# Patient Record
Sex: Male | Born: 1960 | Race: White | Hispanic: No | Marital: Single | Smoking: Current every day smoker
Health system: Southern US, Community
[De-identification: ages and names within clinical notes are randomized; demographics above are authoritative.]

## PROBLEM LIST (undated history)

## (undated) DIAGNOSIS — E119 Type 2 diabetes mellitus without complications: Secondary | ICD-10-CM

## (undated) DIAGNOSIS — I10 Essential (primary) hypertension: Secondary | ICD-10-CM

## (undated) DIAGNOSIS — R569 Unspecified convulsions: Secondary | ICD-10-CM

## (undated) DIAGNOSIS — M549 Dorsalgia, unspecified: Secondary | ICD-10-CM

## (undated) DIAGNOSIS — K219 Gastro-esophageal reflux disease without esophagitis: Secondary | ICD-10-CM

## (undated) DIAGNOSIS — I2699 Other pulmonary embolism without acute cor pulmonale: Secondary | ICD-10-CM

## (undated) DIAGNOSIS — G8929 Other chronic pain: Secondary | ICD-10-CM

## (undated) HISTORY — DX: Type 2 diabetes mellitus without complications: E11.9

## (undated) HISTORY — DX: Other pulmonary embolism without acute cor pulmonale: I26.99

## (undated) HISTORY — PX: COLONOSCOPY: SHX174

## (undated) HISTORY — PX: BACK SURGERY: SHX140

## (undated) HISTORY — PX: UPPER GASTROINTESTINAL ENDOSCOPY: SHX188

---

## 2006-01-12 ENCOUNTER — Ambulatory Visit: Payer: Self-pay | Admitting: Physician Assistant

## 2006-01-20 ENCOUNTER — Ambulatory Visit: Payer: Self-pay | Admitting: Family Medicine

## 2006-01-27 ENCOUNTER — Ambulatory Visit: Payer: Self-pay | Admitting: Family Medicine

## 2006-02-23 ENCOUNTER — Ambulatory Visit: Payer: Self-pay | Admitting: Family Medicine

## 2006-03-12 ENCOUNTER — Ambulatory Visit: Payer: Self-pay | Admitting: Family Medicine

## 2006-04-02 ENCOUNTER — Ambulatory Visit: Payer: Self-pay | Admitting: Physical Medicine and Rehabilitation

## 2006-04-02 ENCOUNTER — Ambulatory Visit: Payer: Self-pay | Admitting: Family Medicine

## 2006-04-02 ENCOUNTER — Encounter
Admission: RE | Admit: 2006-04-02 | Discharge: 2006-07-01 | Payer: Self-pay | Admitting: Physical Medicine and Rehabilitation

## 2006-04-16 ENCOUNTER — Ambulatory Visit: Payer: Self-pay | Admitting: Family Medicine

## 2006-04-23 ENCOUNTER — Encounter: Admission: RE | Admit: 2006-04-23 | Discharge: 2006-04-23 | Payer: Self-pay | Admitting: Neurosurgery

## 2006-05-18 ENCOUNTER — Ambulatory Visit: Payer: Self-pay | Admitting: Critical Care Medicine

## 2006-05-18 ENCOUNTER — Inpatient Hospital Stay (HOSPITAL_COMMUNITY): Admission: RE | Admit: 2006-05-18 | Discharge: 2006-05-26 | Payer: Self-pay | Admitting: Neurosurgery

## 2006-05-30 ENCOUNTER — Encounter (INDEPENDENT_AMBULATORY_CARE_PROVIDER_SITE_OTHER): Payer: Self-pay | Admitting: Specialist

## 2006-05-30 ENCOUNTER — Inpatient Hospital Stay (HOSPITAL_COMMUNITY): Admission: EM | Admit: 2006-05-30 | Discharge: 2006-06-07 | Payer: Self-pay | Admitting: Emergency Medicine

## 2006-05-30 ENCOUNTER — Ambulatory Visit: Payer: Self-pay | Admitting: Critical Care Medicine

## 2006-06-01 ENCOUNTER — Encounter (INDEPENDENT_AMBULATORY_CARE_PROVIDER_SITE_OTHER): Payer: Self-pay | Admitting: Cardiology

## 2006-06-01 ENCOUNTER — Ambulatory Visit: Payer: Self-pay | Admitting: Infectious Diseases

## 2006-06-02 ENCOUNTER — Encounter (INDEPENDENT_AMBULATORY_CARE_PROVIDER_SITE_OTHER): Payer: Self-pay | Admitting: Specialist

## 2006-08-19 ENCOUNTER — Encounter: Admission: RE | Admit: 2006-08-19 | Discharge: 2006-10-15 | Payer: Self-pay | Admitting: Neurosurgery

## 2007-03-15 ENCOUNTER — Ambulatory Visit: Payer: Self-pay | Admitting: Physical Medicine and Rehabilitation

## 2007-03-15 ENCOUNTER — Encounter
Admission: RE | Admit: 2007-03-15 | Discharge: 2007-06-13 | Payer: Self-pay | Admitting: Physical Medicine and Rehabilitation

## 2007-03-17 ENCOUNTER — Ambulatory Visit (HOSPITAL_COMMUNITY)
Admission: RE | Admit: 2007-03-17 | Discharge: 2007-03-17 | Payer: Self-pay | Admitting: Physical Medicine and Rehabilitation

## 2007-04-28 ENCOUNTER — Ambulatory Visit: Payer: Self-pay | Admitting: Physical Medicine and Rehabilitation

## 2007-05-20 ENCOUNTER — Emergency Department (HOSPITAL_COMMUNITY): Admission: EM | Admit: 2007-05-20 | Discharge: 2007-05-20 | Payer: Self-pay | Admitting: Emergency Medicine

## 2007-06-07 ENCOUNTER — Encounter
Admission: RE | Admit: 2007-06-07 | Discharge: 2007-09-05 | Payer: Self-pay | Admitting: Physical Medicine and Rehabilitation

## 2007-06-10 ENCOUNTER — Ambulatory Visit: Payer: Self-pay | Admitting: Physical Medicine and Rehabilitation

## 2007-07-08 ENCOUNTER — Emergency Department (HOSPITAL_COMMUNITY): Admission: EM | Admit: 2007-07-08 | Discharge: 2007-07-08 | Payer: Self-pay | Admitting: Emergency Medicine

## 2007-07-11 ENCOUNTER — Ambulatory Visit: Payer: Self-pay | Admitting: Physical Medicine and Rehabilitation

## 2008-07-30 IMAGING — RF DG LUMBAR SPINE 2-3V
1 series · 2 of 2 positions shown · non-contrast
Comparison: none

CLINICAL DATA: L5-S1 herniated disk, posterior fusion.  
LUMBAR SPINE ? 2 VIEW:

[Series 1: run · 2 of 2 slices shown]
[im 1/2]
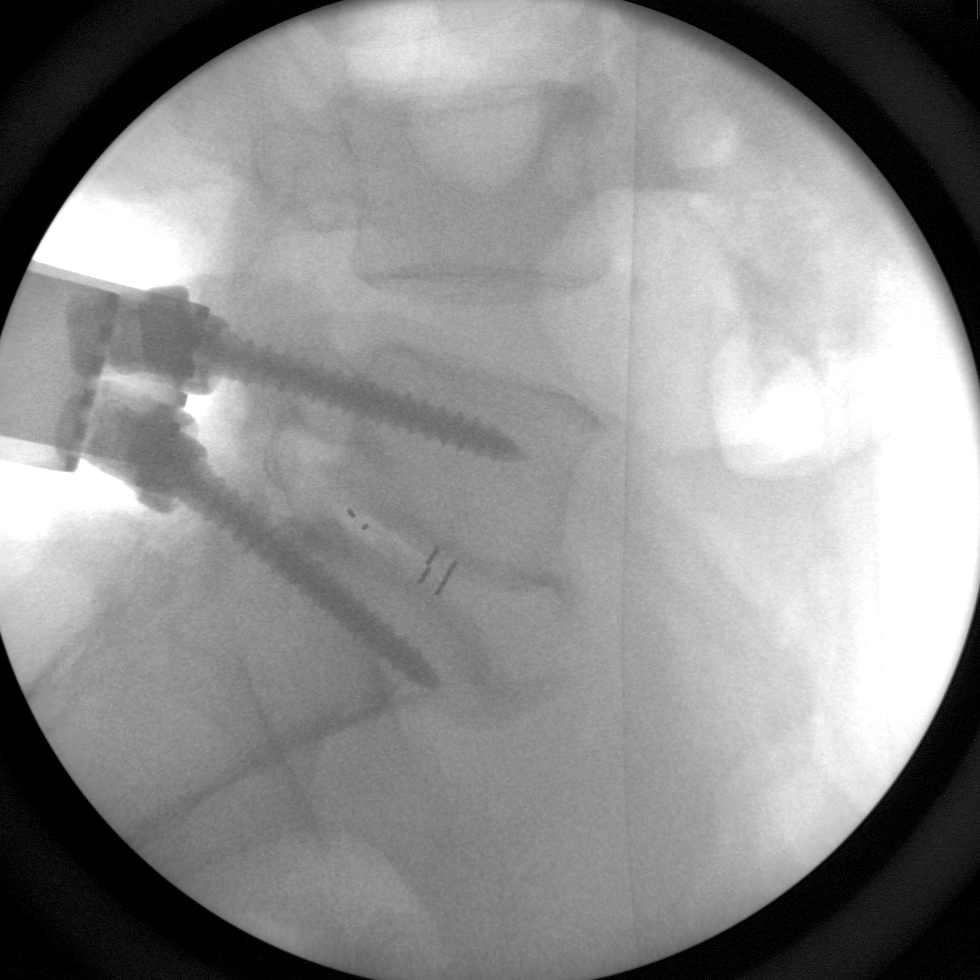
[im 2/2]
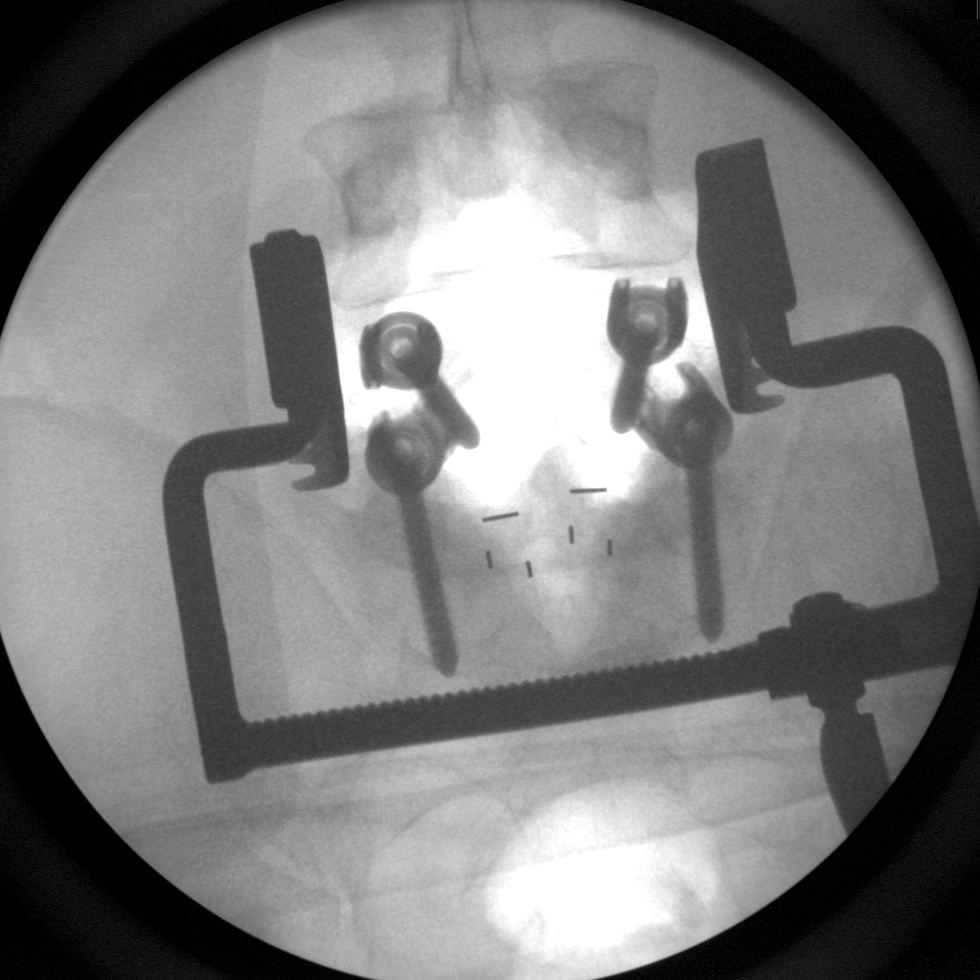

[2 of 2 positions shown; findings below may reference images not displayed]

FINDINGS: Two spot fluoroscopic views demonstrate posterior fusion, pedicle screws and hardware at L5-S1 with a disk spacer.
IMPRESSION: L5-S1 fusion, intraoperative imaging.

## 2008-07-30 IMAGING — CR DG LUMBAR SPINE 1V
1 series · 1 of 1 positions shown · non-contrast
Comparison: none

CLINICAL DATA: L4 laminectomy and L5-S1 diskectomy with PLIF.
 LUMBAR SPINE ? 1 VIEW ? 05/18/06: 
 A single portable intraoperative lateral view of the lumbar spine is provided.

[view not recorded]
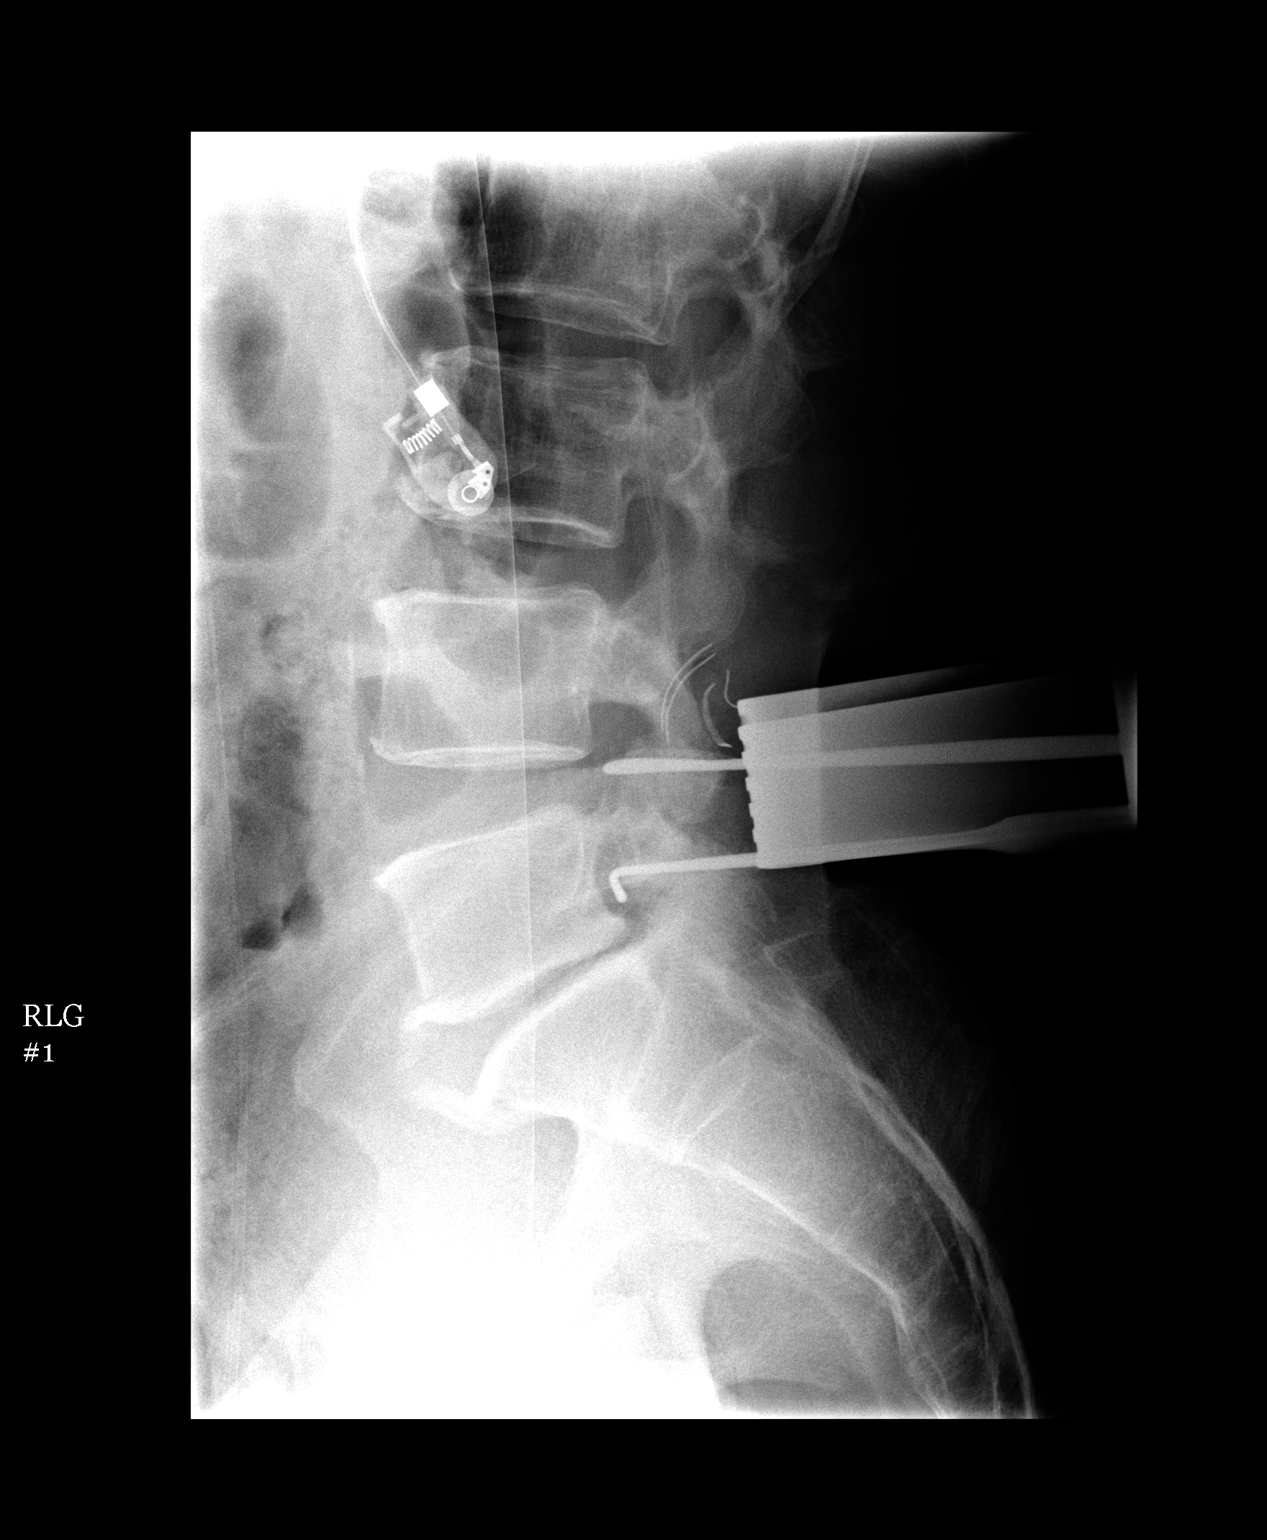

[1 of 1 positions shown; findings below may reference images not displayed]

FINDINGS: Tissue spreaders are in place.  A metallic probe is directed towards the L4-5 level.  A second inferior probe is at the level of the L5-S1 foramina.
IMPRESSION: As discussed above.

## 2008-07-31 IMAGING — CR DG CHEST 1V PORT
1 series · 1 of 1 positions shown · non-contrast
Comparison: 05/14/06.

CLINICAL DATA: Shortness of breath. 
 PORTABLE CHEST - 1 VIEW ? 05/19/06:

[view not recorded]
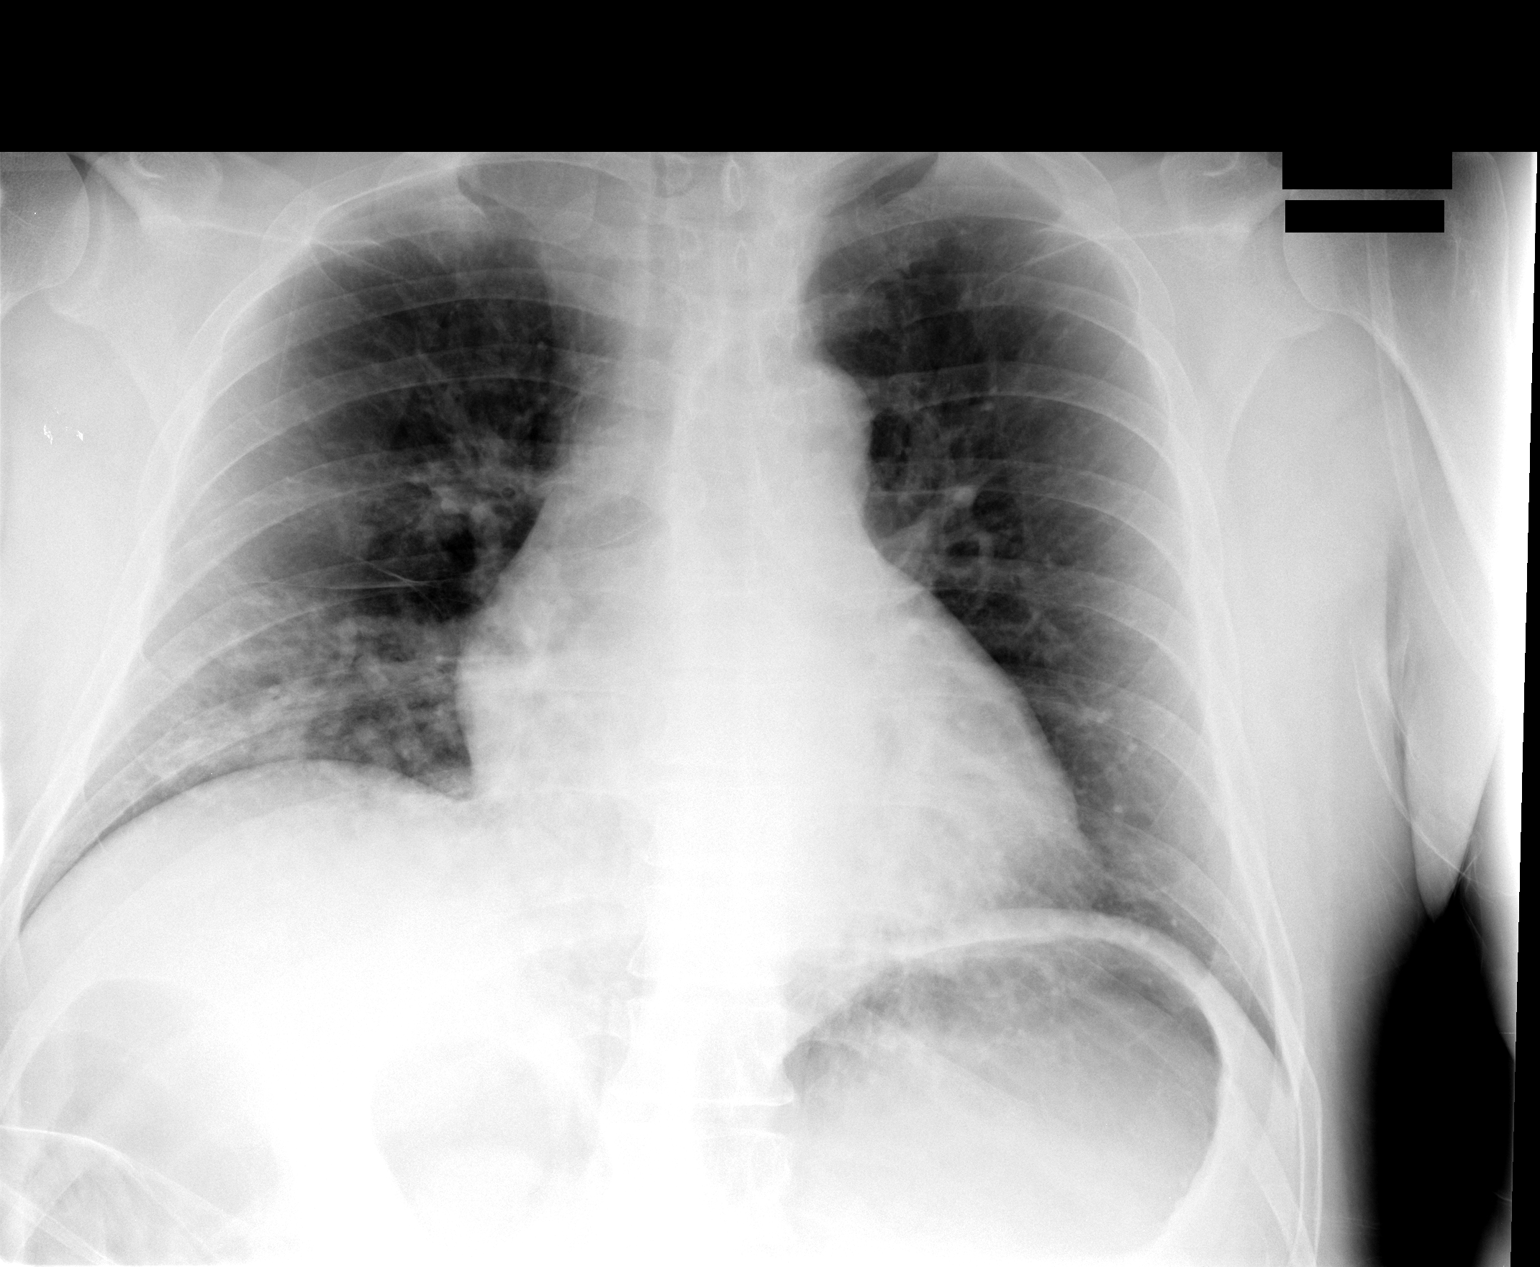

[1 of 1 positions shown; findings below may reference images not displayed]

FINDINGS: There has been interval development of reticular nodular airspace opacities over the right lower lobe, most consistent with pneumonia.  The left lung remains clear.  Heart size is at the upper limits for normal.
IMPRESSION: New right lower lobe airspace opacities most consistent with pneumonia.

## 2008-08-01 IMAGING — CR DG CHEST 1V PORT
1 series · 1 of 1 positions shown · non-contrast
Comparison: 05/19/06.

CLINICAL DATA: Spondylosis/follow-up pneumonia.
 PORTABLE CHEST - 1 VIEW:

[view not recorded]
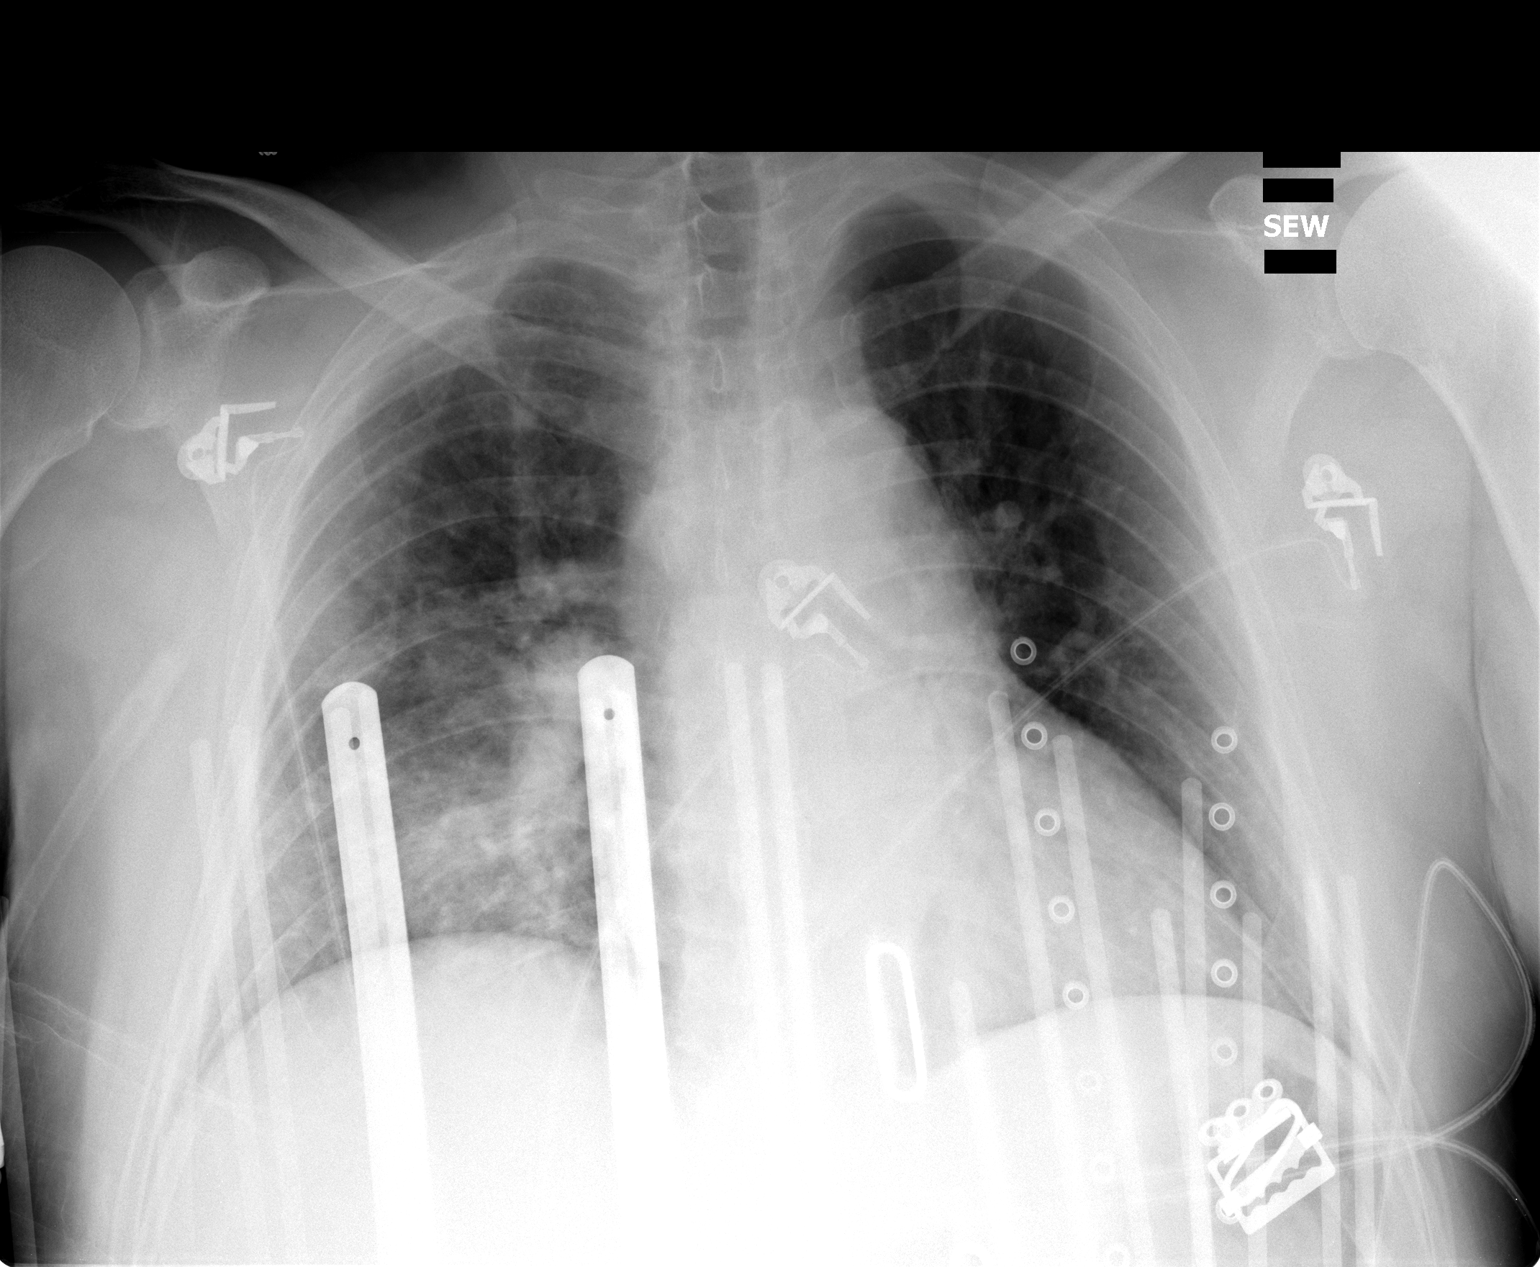

[1 of 1 positions shown; findings below may reference images not displayed]

FINDINGS: The patient is now wearing a back brace with numerous metallic struts.  These are superimposed over the right lower lobe where the patient is known to have airspace infiltrate based on yesterday?s study.
 I believe that the density is a bit more prominent, although it is difficult to accurately compare these two studies because of the overlying artifacts. The left lung appears clear.
IMPRESSION: 1.  Study limited by metallic artifacts related to a back brace that has been applied.
 2.  Probable interval worsening of right lower lobe airspace disease.

## 2008-09-24 ENCOUNTER — Emergency Department (HOSPITAL_COMMUNITY): Admission: EM | Admit: 2008-09-24 | Discharge: 2008-09-24 | Payer: Self-pay | Admitting: Emergency Medicine

## 2009-06-22 ENCOUNTER — Emergency Department (HOSPITAL_COMMUNITY): Admission: EM | Admit: 2009-06-22 | Discharge: 2009-06-22 | Payer: Self-pay | Admitting: Emergency Medicine

## 2010-03-30 ENCOUNTER — Encounter: Payer: Self-pay | Admitting: Neurosurgery

## 2010-06-15 LAB — BASIC METABOLIC PANEL
BUN: 13 mg/dL (ref 6–23)
Chloride: 106 mEq/L (ref 96–112)
Glucose, Bld: 84 mg/dL (ref 70–99)
Potassium: 3.5 mEq/L (ref 3.5–5.1)

## 2010-06-15 LAB — POCT CARDIAC MARKERS: Troponin i, poc: <0.05 ng/mL (ref 0.00–0.09)

## 2010-07-22 NOTE — Assessment & Plan Note (Signed)
[name] is a 50 year old gentleman who has been referred by Dr.  Jeral Burch.  [name] was last seen by me on April 29, 2007.  He is  back in today for a brief recheck.  He continues to have complaints of  low thoracic pain, low lumbar pain, bilateral leg pain.  States his  average pain is about 8 on a scale of 10.  He indicates that his general  activity is significantly compromised by his pain.  He reports poor  sleep but does state today that he is sleeping much better since he is  not using quite so much caffeine anymore.  Pain, overall, is worse with  walking, bending, standing.  Improved with medications.  He is currently  getting good relief with the current meds that he is on.   MEDICATIONS PROVIDED FROM THIS CLINIC:  Percocet 5/325 3 times a day.    He has an NSAID intolerance.   FUNCTIONAL STATUS:  He is currently walking about 18 minutes at a time.  He is able to climb stairs, he is able to drive, he is able to walk  without assistance.  He is independent with his self-care.  Denies  problems controlling bowel or bladder.  Reports occasional depression.  Denies suicidal ideation.  Has some intermittent abdominal pain.  I  asked him to discuss this with his primary care physician, Dr. Hanley Burch.   PAST MEDICAL HISTORY:  No other changes in past medical history.   SOCIAL HISTORY:  Remarkable for death in the family.  His 72 year old  nephew died suddenly and the family is currently grieving.   PHYSICAL EXAMINATION:  VITAL SIGNS:  Blood pressure 132/87, pulse 72,  respirations 18, 99% saturated on room air.  GENERAL:  Well-developed, well-nourished gentleman who is quite tan and  states that he has been using his sister's tanning bed.  He does not  appear in any distress.  He is oriented.  Speech is clear.  His affect  is bright.  Alert, cooperative and pleasant.  He follows commands  easily.  Transitioning from sitting to standing is done with ease.  Gait in the  room is  non-antalgic.  Tandem gait, Romberg's test are performed  adequately.  Limitations are noted in lumbar motion in all planes.  Not  much more than about 25 degrees of forward flexion, essentially no  extension.  Reflexes are 1+ in the patella tendons, diminished at the  Achilles tendon.  Tone is normal, no clonus is noted.  Motor strength is  good in both lower extremities.   IMPRESSION:  1. Low back pain.  2. Bilateral leg pain.  3. Status post L4-5 lumbar laminectomy and L5-S1 interbody fusion      May 18, 2006, complicated by postoperative infection.  4. Trochanteric bursitis/iliotibial band syndrome, worse on the left      than on the right.  5. Reduction in overall caffeine usage.  6. Nicotine addiction.   PLAN:  [name] brings in his pain diary today, which includes his  activity as well.  He has indicated that he is walking at least 20  minutes a day and he has reduced his caffeine use from 2 L a day down to  1 energy drink in the morning.  We will switch him today from Percocet  5/325 3 times a day to Ultracet 2 p.o. t.i.d., #180, no refills and  will restart him back on the Neurontin for leg pain, 300 mg 1 p.o. at  bedtime x3 days, then b.i.d. x3 days, then t.i.d. x3 days, then q.i.d.  We will see him back in a month.  He was also given another pain diary  for next visit.           ______________________________  Brantley Stage, M.D.     DMK/MedQ  D:  06/10/2007 13:12:53  T:  06/10/2007 14:13:22  Job #:  [id number]

## 2010-07-22 NOTE — Assessment & Plan Note (Signed)
William Burch is a 50 year old married gentleman who is a referral from Dr.  Jeral Fruit.  He was seen last by me on April 01, 2007.  William Burch has  primary complaints of low back pain and intermittent left lower  extremity pain.  His average pain is about an 8 on a scale of 10.  He  describes his pain as constant, tingling, aching, sharp in nature. His  sleep tends to be poor, although he states he drinks at least two liters  of [facility] a day.   The pain is worse with walking, bending, standing, improves with his  medications and TENS unit.  He gets fair relief with current med's that  are prescribed to him.   MOBILITY/FUNCTIONAL STATUS:  The patient, at the last visit, was able to  walk about 8 minutes at a time. Currently he is up to 10 to 12.  He has  been walking daily now.  He is able to climb stairs and he is able to  drive. 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   He is independent with feed, bathing, toileting, dressing.  He is  independent with meal prep, shopping, needs some assistance with higher  level household tasks.   He admits to intermittent numbness and tingling in the lower  extremities.  Occasional trouble walking and depression.  He denies  suicidal ideation.   REVIEW OF SYSTEMS:  Otherwise noncontributory in regards to today's  problems.   PAST MEDICAL/SOCIAL/FAMILY HISTORY:  Unchanged, other than that he has  attempted to start acquiring his GED on line.  He is a smoker as well.   MEDICATIONS:  Medications prescribed by our clinic include:  1. Percocet 10/325 up to 2 per day.  2. He had been on Lyrica but this has been discontinued.  3. He also uses Dulcolax on a p.r.n. basis for constipation.   PHYSICAL EXAMINATION:  VITAL SIGNS:  On exam today his blood pressure is  115/75, pulse 72, respirations 18, 99% saturated on room air.  GENERAL APPEARANCE:  He is a well-developed, well-nourished gentleman  who  does not appear in any distress.  He is oriented x3 and his speech  is clear.  His affect is bright.  He is alert, cooperative and pleasant  and he follows commands without difficulty.   He is able to transition easily from sitting to standing.  His gait in  the room is within normal limits and no antalgia is appreciated.   Reflexes are 1+ at patellar tendons, diminished at the Achilles tendon.  No abnormal tone is normal.  No clonus is noted.  He reports intact  sensation to light touch.  Motor strength is good in both lower  extremities, 5/5 at hip flexors, knee extensors, dorsiflexors, plantar  flexors.   He has significant tenderness along the trochanters and down the  iliotibial band bilaterally.   There is no abnormal tone, no clonus is noted.   IMPRESSION:  1. Status post L4-L5 lumbar laminectomy and L5-S1 interbody fusion,      May 18, 2006 complicated by postoperative infection.  2. Lumbago.  3. Trochanteric bursitis/iliotibial band syndrome, worse on left than      on right.  4. Caffeine use.  5. Nicotine addiction.   PLAN:  William Burch intends to cut down on his caffeine intake.  He is  going to monitor the amount of [facility] he has been taking.  He is  requesting medications for insomnia, however, I would like him to reduce  the amount of [facility] he has been taking in.  He is taking at least  2 liters per day.   We will refill the following medications for him today:  1. Percocet 5/325 mg one p.o. q.8h. p.r.n. back pain, #90, no refills.  2. He is also going to continue to work on walking each day.  He will      document this daily in his pain diary and bring this in next month      as well so that I can see how he is doing.  3. Over the next couple of months we would like to transition him to      Ultracet.  He is also in agreement with this plan and he is going      to continue to work on getting his GED as well.   I will see him back in one month.   He has been stable on the above  medications.  We will continue to monitor for any aberrant medication  use.           ______________________________  Brantley Stage, M.D.     DMK/MedQ  D:  04/29/2007 14:37:48  T:  04/30/2007 11:53:22  Job #:  [id number]   cc:   Hilda Lias, M.D.  Fax: [phone]

## 2010-07-22 NOTE — Assessment & Plan Note (Signed)
BRIEF NOTE:  William Burch underwent urine drug screening on Jul 11, 2007.  A report of the urine drug screen by [employer] revealed positive cocaine  metabolites, benzoylecgonine 6,653 NG per ml.  The patient was informed  of his urine drug screen result by certified mail and he was discharged  from the center for pain and rehabilitative medicine, and it was  recommended that he follow up in a drug rehabilitation program.           ______________________________  Brantley Stage, M.D.     DMK/MedQ  D:  07/18/2007 14:32:57  T:  07/18/2007 15:51:41  Job #:  [id number]

## 2010-07-22 NOTE — Assessment & Plan Note (Signed)
William Burch is back in today for a refill of his medication.   He states in the interim he had a fall on some stone steps outside.  He  twisted his ankle and was seen in the emergency department.  He has been  given an air cast and crutched and is going to follow up with  orthopedist for management of this acute injury.   He continues to complain of pain in his low back and bilateral legs,  left worse than right.  Pain is a 9/10, goes down the thigh, down to the  foot.  Bilateral calves are aching, feels like needles are sticking in  them.   Average pain is about a 9 on a scale of 10 interfering significantly  with activity levels, reporting poor sleep.   Patient is concerned about his back and when he fell he is concerned may  have had some hardware disruption.  Apparently, he did not have any x-  rays of his back in the emergency room.   He gets good relief with current meds that he is on.   FUNCTIONAL STATUS:  He is able to walk about 10 minutes at a time,  however, most of the day he is watching TV.  He has difficulty climbing  stairs at this time.  He is able to drive.  He is independent with self-  care and needs assistance with higher-level activities in the home.  Denies problems controlling bowel or bladder.  Admits to a little  depression.  Denies suicidal ideation.  No other changes on review of  systems are noted.   PAST MEDICAL, SOCIAL, FAMILY HISTORY:  Otherwise unchanged.   EXAM:  Blood pressure is 129/90.  Pulse 118.  Respirations 18, 97%  saturated on room air.  Well-developed, well-nourished gentleman who  does not appear in any distress.  He is oriented x3.  Speech is clear.  Affect is alert.  He is cooperative and pleasant.  Overall quiet  demeanor.   He has an air cast on his left ankle and he has crutches with him.   Gait is not tested due to nonweightbearing on that left side.  He has  limitations in lumbar motion.  Overall, good strength is noted in  the  lower extremity.  He reports diminished sensation patchy throughout both  lower extremities as well.   Tone is normal, however.  Left ankle is observed today.  He has edema  diffusely throughout the ankle.  He has difficulty, very limited motion  with flexion and extension at this time with dorsiflexion and plantar  flexion of the ankle.  Tenderness is noted throughout the ankle.  No  ecchymosis is noted.   IMPRESSION:  1. Low back pain status post recent fall.  2. Left ankle sprain.  3. Bilateral lower extremity pain.  4. Status post L4-5 lumbar laminectomy and L5-S1 interbody fusion,      May 18, 2006, complicated by postoperative infection.  5. Trochanteric bursitis, iliotibial band, worse on the left than on      the right.   PLAN:  [name] is requesting followup with Dr. Jeral Fruit after this fall  on Jul 08, 2007.  He has concerns about effusion.  He states his back is  worse and is concerned about his hardware.  Pain is mainly in the mid  lumbar area in the right.   We will obtain lumbar radiographs in the interim, AP, lateral,  flexion/extension, lumbar spine films.  I  request that he follow up with  his orthopedist for his acute ankle sprain.  We will refill the  following medications for him:  1. Prilosec 20 mg 1 p.o. daily, #30.  2. Ultram 50 mg 1 p.o. daily p.r.n. back or leg pain, #60.   I will see him back in a month.           ______________________________  Brantley Stage, M.D.     DMK/MedQ  D:  07/13/2007 09:47:12  T:  07/13/2007 10:12:58  Job #:  [id number]

## 2010-07-22 NOTE — Assessment & Plan Note (Signed)
[name] is a 50 year old gentleman who has been referred back by Dr.  Jeral Fruit.   [name] was initially seen by me last year in January of 2008.  In  the interim, [name] has undergone an L5-S1 fusion complicated by a  postoperative infection.   [name] states he continues to have low back pain and left leg pain.  The pain in the low back radiates through the left buttock region  posteriorly and down into the left calf.  His leg pain is about a 9 on a  scale of 10, in the low back and buttock is about an 8.  Average pain is  an 8 overall.  He states his pain is constant, tingling, achy and sharp  in nature, interfering a lot with general activity.  The pain is  typically worse in the morning and toward the end of the day, worse with  bending, sitting, standing.  Improves with medication.  He is getting  just a little bit of relief with the current medications that he is on.  He is currently taking up to 2 Percocet a day, 10/325.  He has been  trialed on other anti-inflammatory medications, including meloxicam,  which he states made his heart race.  He has also been trialed on  Neurontin and Lyrica, however, on smaller doses.   FUNCTIONAL STATUS:  He is able to walk between 8 to 10 minutes at a  time.  He is able to drive, climb stairs.  He has a Regulatory affairs officer.  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  He last worked back in 2007.  He is independent with his self-  care.  Needs some assistance with higher level household activities.  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  He does have somebody who helps him out with  that.   He denies problems controlling bowel or bladder.  Denies anxiety.  Admits to some depression but denies suicidal ideation.  Admits to some  numbness and tingling in the left lower extremity.   Has some abdominal pain.  He reports he is constipated.   Reports he  has some high blood pressure, is currently not on any  medications for it at this time.   He is currently living with his sister.  Smokes a pack of cigarettes a  day  although he states that he has been down to 1 cigarette a day in  the last week or so.   PHYSICAL EXAMINATION:  VITAL SIGNS:  Blood pressure 130/71, pulse 84,  respirations 18, and 97% saturated on room air.  GENERAL:  This is a well-developed, well-nourished gentleman who does  not appear in any distress.  He is oriented x 3.  Speech is clear.  Affect is bright.  He is alert, cooperative and pleasant.  He follows  commands without any difficulty.   Transitioning from sitting to standing is done easily.  Gait in the room  is antalgic.  Decreased weightbearing through the left lower extremity.  Limitations noted in lumbar motion in all planes.   Reflexes are diminished at the Achilles tendon and present at the  patellar tendon.  No abnormal tone is noted.  No clonus is noted.  Motor  strength is quite good.  Half a grade of weakness noted in the left EHL  compared to the right.  Straight leg raise  negative.   Significant pain noted with internal rotation in the left hip.   Tenderness is noted down both iliotibial bands and noted over the  trochanters bilaterally, worse on the left than on the right as well.   IMPRESSION:  Status post lumbar four laminotomy and foraminotomy, lumbar  five laminotomy with facetectomy, interbody lumbar five-sacral one  fusion, decompression.  Date of surgery May 18, 2006, complicated by  postoperative infection.   The patient has continued complaints of low back pain and left leg pain  with tenderness down the iliotibial band and also pain with internal and  external rotation of the left hip.   PLAN:  Would like to rule out any hip pathology on the left.  Given that  he has worked in [employer] he has had a little more risk for  osteoarthritis of this joint.   We will obtain  x-rays of the low back as well as bilateral hips today.   Would like to eventually get him involved in some physical therapy to  address soft tissue and mobility deficits.   We will trial him on Lyrica.  We will start him on 50 and bring his dose  to 150 over the next 2 weeks.  I will see him back in 2 weeks, review  his x-rays with him.  May consider epidural steroid injections.  May  need to discuss further with Dr. Jeral Fruit regarding the recent fusion.  He  does have a TENS unit, which is a good idea for him.  We will write a  prescription for a Velcro lumbar support, which he can put on for  activities.  We will address his constipation today as well, give him  some medication to help with that.  A urine drug screen is pending.  We  will hold off on prescribing narcotic medications until this is  reviewed.  [name] agrees thus far with our plan, and I have answered  all his questions and we will see him back in 2 weeks.           ______________________________  Brantley Stage, M.D.     DMK/MedQ  D:  03/16/2007 14:51:59  T:  03/16/2007 16:27:56  Job #:  [id number]   cc:   Hilda Lias, M.D.  Fax: [phone]

## 2010-07-22 NOTE — Assessment & Plan Note (Signed)
William Burch is a 50 year old gentleman referred by Dr. Jeral Fruit.   He was last seen by me on March 16, 2007.  In the interim, William Burch  has undergone an L5-S1 fusion complicated by a postoperative infection.   He is back in today to review some recent tests, and for prescription of  medications.   His urine drug screen was consistent with the medications prescribed by  Dr. Jeral Fruit and in appropriate doses.   His hip radiographs were negative.  His lumbar spine film reveals L5-S1  fusion without evidence of complicating features, degenerative disk  changes at L1-2.   William Burch states that his average pain is about 8/10.  He does get good  relief with Percocet that he was taking twice a day.  However, he has  been out of it for a couple weeks now.   His sleep tends to be poor.  Pain is typically localized to the low back  and posteriorly, especially down the left leg and in the left posterior  thigh.   Pain has been improved a bit by wearing the lumbar support, which was  ordered at the last visit.  William Burch obtained the support the same day  that it was ordered, and has been wearing it regularly over the past 2  weeks.   Pain typically is worse with walking, bending, standing.  Improves with  medication.   He is able to walk between 8 and 10 minutes at a time.  He is able to  climb stairs and he does drive.   He is independent with his self care.   He admits to some depression.  Denies suicidal ideation.  Denies  problems controlling bowel or bladder.   He does have some problems with constipation.   PAST MEDICAL/SOCIAL/FAMILY HISTORY:  Otherwise, unchanged from last  visit, although he is complaining of some sinusitis and requesting some  antibiotics from our clinic.  However, I redirect him to his primary  care doctor, Dr. Janna Arch.   Mr. Lant also states that he has some disability hearings pending.   MEDICATIONS PRESCRIBED BY THIS CLINIC:  1. Lyrica 50 mg 1  p.o. t.i.d.  2. Dulcolax tablets.   William Burch states that the Lyrica made his feet swell after about the  third dose, and he stopped taking it.   EXAM:  His blood pressure is 138/91, pulse 81, respirations 18, 99%  saturation on room air.  He is a well-developed, well-nourished gentleman who appears somewhat  tired, but is oriented and follows commands without difficulty.  During  moments of our interview, he does brighten up and smile.   He is able to transition from sitting to standing easily.  His gait in  the room is slow, but quite stable.  No antalgia is appreciated.  His  reflexes are 1+ patellar and Achilles tendons.  Motor strength is good  in both lower extremities.  He has some tightness around the left hip  with range of motion.   There is no abnormal tone noted.  No clonus is noted.  Sensory exam is  not done today.   IMPRESSION:  1. Status post L4-5 lumbar laminectomy and L5-S1 inner body fusions      May 18, 2006 complicated by postoperative infection.  2. Lumbago.  3. Bilateral leg pain, worse on left than on right.  4. Probable trochanteric bursitis, worse on left than on right.   PLAN:  1. After discussing multiple treatment options with Mr.  Burch, he      states he is not interested in taking Lyrica or Neurontin.  He      states he is not interested in any kind of injections to help      manage his low back or leg pain.  2. Antiinflammatory medications in the past have given him stomach      upset and he is not interested in any more of these.   He states the only thing that really helps him is Percocet, which he has  been taking twice a day.   The lumbar support also has given him some relief.   After a long discussion, 30 minutes, William Burch has agreed to try to  slowly increase his walking over the next month.  He is currently able  to walk 10 minutes at a time several times a day.  He will add 2 minutes  each week to improve his overall  activity.   We discussed that we will do a trial of Percocet with him over the next  2 months and anticipate weaning him off of Percocet the beginning of  April.   He has agreed with this plan.  He states he may be interested in  starting Neurontin some time in April, then.  He was given a sheet to  monitor his activity levels.  He is also going to look into obtaining  his GED.  We will see him back again in 1 month.  He was given a  prescription of Percocet 10/325 one p.o. b.i.d. p.r.n. back pain #60  anticipating that this is for a short trial over the next 2 months.  At  that time, we will probably transition him to Ultracet.  He understands  and is aware, and is in agreement with this plan.  We will see him back  in a month.  He is also requesting a letter today stating that he is  currently under my care.           ______________________________  Brantley Stage, M.D.     DMK/MedQ  D:  04/01/2007 09:18:05  T:  04/01/2007 09:54:32  Job #:  [id number]

## 2010-07-25 NOTE — Group Therapy Note (Signed)
INITIAL VISIT   [name] is a single, separated, 50 year old, white gentleman,  who is currently filing for disability.  He presents to our pain and  rehabilitative clinic with predominant complaints of pain in his  bilateral calves.   He states his average pain is about a 8 or a 9 on a scale of 10, that is  worsened when he is up walking and is worsened when he sleeps.  Pain is  described as sharp and aching in nature, fairly constant, interfering  quite a bit with his activity levels.  He states he has overall poor  sleep.   Pain improves with medications.  He states that the only medicine he has  found fairly good relief with is Norco, hydrocodone acetaminophen  combination.   He states he does have an appointment with Dr. Arville Lime this Thursday for  further evaluation.   William Burch states he has had a long history of low back pain, and  approximately 2 years ago was followed [place] [facility].           ______________________________  Brantley Stage, M.D.     DMK/MedQ  D:  04/05/2006 12:40:46  T:  04/05/2006 13:34:47  Job #:  [id number]

## 2010-07-25 NOTE — Op Note (Signed)
William Burch, William Burch NO.:  [id number]   MEDICAL RECORD NO.:  [id number]          PATIENT TYPE:  INP   LOCATION:  2550                         FACILITY:  [facility]   PHYSICIAN:  Hilda Lias, M.D.   DATE OF BIRTH:  1960/10/28   DATE OF PROCEDURE:  05/31/2006  DATE OF DISCHARGE:                               OPERATIVE REPORT   ADMISSION DIAGNOSIS:  Rule out wound infection, sepsis, status post  lumbar fusion.  Status post pneumonia.   POSTOPERATIVE DIAGNOSIS:  Rule out wound infection, sepsis, status post  lumbar fusion.  Status post pneumonia.   PROCEDURE:  Incision and drainage of lumbar wound.   SURGEON:  Hilda Lias, M.D.   ASSISTANT:  Stefani Dama, M.D.   HISTORY OF PRESENT ILLNESS:  The patient, [name], underwent fusion  several weeks ago.  The patient soon after developed fever and it was  found by x-rays that he had pneumonia.  He was seen by critical care  unit and eventually he had intravenous antibiotics and was discharged  after he was afebrile.  Today he showed up at the emergency room with  increasing pain, being unable to move and quite tender in lumbar area.  MRI showed fluid in that area and the chest x-ray was negative.  Because  of the findings surgery was advised.   PROCEDURE:  The patient was taken to the operating room positioned prone  manner.  The back was prepped with DuraPrep.  Incision following the  previous one was made.  The subcutaneous tissue was cleared, we opened  the fascia and what we found was sort of serosanguineous fluid but there  was no evidence of any pus either by smell or by looking.  Nevertheless  specimen was sent to laboratory for aerobic and anaerobic as well tissue  was sent for further pathology work.  After that the area was irrigated.  Hemovac was left in the epidural area and the wound was closed with  Vicryl and staples.  We are going to because of the findings during  surgery does not explain  his temperature of 104 as well as the  sedimentation rate of over 100, we are going to call infectious disease  plus critical care to help [place] with his care.           ______________________________  Hilda Lias, M.D.     EB/MEDQ  D:  05/31/2006  T:  05/31/2006  Job:  [id number]

## 2010-07-25 NOTE — H&P (Signed)
NAMEZEEK, ROSTRON NO.:  [id number]   MEDICAL RECORD NO.:  [id number]          PATIENT TYPE:  INP   LOCATION:  2550                         FACILITY:  [facility]   PHYSICIAN:  Stefani Dama, M.D.  DATE OF BIRTH:  March 05, 1961   DATE OF ADMISSION:  05/30/2006  DATE OF DISCHARGE:                              HISTORY & PHYSICAL   ADMISSION DIAGNOSIS:  Lumbar wound infection.   HISTORY OF PRESENT ILLNESS:  Mr. Sciandra is a 50 year old individual who  was initially evaluated at the end of January for back pain and also  neck pain.  He has been having neck pain with radiation to both upper  extremities and also back pain with radiation to both lower extremities.  He was scheduled to have surgery in [facility] and later on by a  neurosurgeon here in [place].  Somehow, he lost his insurance and he  has never had follow up.  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   PAST MEDICAL HISTORY:  History of double pneumonia in the last month and  a half.  He was treated by the critical care service and Dr. Danise Mina.  This condition has been remedied, and the patient is felt to be ready  for surgical intervention on his lumbar spine.  He was admitted.  He was  discharged home on May 26, 2006.  It was noted that during the  postoperative period, he complained significantly of pain.  He had been  given vancomycin and Fortaz during the postoperative period, and he was  discharged home on significant pain medication.   PHYSICAL EXAMINATION:  GENERAL:  He is a very ill-appearing individual  in moderate distress.  He is lying in the fetal position.  He will not  straighten his back.  Any palpation of his back reveals  significant pain  and tenderness.  Percussion tenderness was not tried, as simple  palpation caused substantial pain.  The patient also complained of pain  across the neck and the shoulders.  He will lie flat with only great  difficulty and great discomfort.  He does move his legs well to  confrontational testing, as best I can determine.  His leg strength is  at least 4/5 in the iliopsoas, the quadriceps, the tibialis anterior and  the gastrocs.  His deep tendon reflexes are 1+ in the patellae, trace in  the Achilles.  Babinski's are downgoing.  Upper extremity reflexes are  2+ in the biceps, 1+ in the triceps, 1+ in brachial radialis.  HEENT:  Cranial nerve examination reveals the pupils are 4 mm, briskly  reactive to light and accommodation.  Extraocular movements are full.  The face is symmetric.  Tongue and uvula are midline.  Sclerae and  conjunctivae are clear.  NECK:  No masses, no bruits are  heard.  LUNGS:  Clear to auscultation.  HEART:  Regular rate and rhythm.  No murmur is heard.  ABDOMEN:  Soft.  Bowel sounds are positive.  No masses are palpable.  EXTREMITIES:  No cyanosis, clubbing or edema.  BACK:  A well-healed incision that is extremely tender.  There is a  slight reddish hue over the surface of the skin, but no induration that  would suggest the presence of an obvious infection.   IMPRESSION:  The patient is extremely tender in the low back and is  exquisitely uncomfortable in any given position save for the fetal  position.  He has a temperature to 103.  His white count is 13,000.  An  MRI demonstrates the presence of fluid collection in the surgical site.  He is now to be admitted and taken to the operating room to undergo  surgical debridement of his lumbar wound.  His sedimentation rate noted  this evening is 105.      Stefani Dama, M.D.  Electronically Signed     HJE/MEDQ  D:  05/30/2006  T:  05/31/2006  Job:  [id number]

## 2010-07-25 NOTE — Discharge Summary (Signed)
[name], [name] NO.:  [id number]   MEDICAL RECORD NO.:  [id number]          PATIENT TYPE:  INP   LOCATION:  3035                         FACILITY:  [facility]   PHYSICIAN:  Hilda Lias, M.D.   DATE OF BIRTH:  1960-11-22   DATE OF ADMISSION:  05/30/2006  DATE OF DISCHARGE:  06/07/2006                               DISCHARGE SUMMARY   ADMISSION DIAGNOSIS:  Wound infection.   FINAL DIAGNOSIS:  Fever of undetermined origin.   CLINICAL HISTORY:  William Burch is a gentleman who was admitted several  weeks ago for lumbar fusion.  Immediately after surgery, he developed a  pneumonia.  He was seen by the pulmonologist and with conservative  treatment, including antibiotic, he became afebrile and discharged.  He  went home, but he came back 72 hours later with temperature going up to  107.  A complete febrile workup was done, and because of the possibility  of infection, because of the MRI, we proceeded with I&D of the lumbar  wound.  He was admitted to the hospital.   LABORATORY:  At the present time, the wound culture was negative.  The  electrolytes are normal.  The white cell is at 10.7.  Blood culture  urine, and the wound culture from the day of surgery was also negative.   COURSE IN THE HOSPITAL:  The patient was taken to surgery.  I&D of the  lumbar wound was done.  During this procedure, we found no evidence of  any infection whatsoever.  Culture, which were sent to the laboratory  were negative.  The patient was seen by infectious disease.  He was put  on antibiotics, and the first two days, the temperature went to 107 and  106 and the white cell was normal.  Eventually with antibiotics, the  temperature came back to normal.  Today he is doing great.  He is  ambulating.  He has no fever.  He has been afebrile for at least 72  hours.  The wound looks fine.  He is ready to go home.  Again, all the  cultures were negative.   CONDITION ON DISCHARGE:   Improving.   MEDICATIONS:  He is going to be taking Cipro and Zovyr as per infectious  disease.  He will continue taking the pain medication, the muscle  relaxant.   FOLLOWUP:  He will be seen by me in 3 weeks or as needed.   CONDITION ON DISCHARGE:  Improvement.   ACTIVITY:  Not to drive, not to do any heavy lifting.           ______________________________  Hilda Lias, M.D.     EB/MEDQ  D:  06/07/2006  T:  06/07/2006  Job:  [id number]

## 2012-01-05 ENCOUNTER — Encounter (HOSPITAL_COMMUNITY): Payer: Self-pay | Admitting: *Deleted

## 2012-01-05 ENCOUNTER — Emergency Department (HOSPITAL_COMMUNITY)
Admission: EM | Admit: 2012-01-05 | Discharge: 2012-01-05 | Disposition: A | Discharge status: Home / Self Care | Payer: Self-pay | Attending: Emergency Medicine | Admitting: Emergency Medicine

## 2012-01-05 DIAGNOSIS — Z79899 Other long term (current) drug therapy: Secondary | ICD-10-CM | POA: Insufficient documentation

## 2012-01-05 DIAGNOSIS — M549 Dorsalgia, unspecified: Secondary | ICD-10-CM | POA: Insufficient documentation

## 2012-01-05 DIAGNOSIS — M436 Torticollis: Secondary | ICD-10-CM | POA: Insufficient documentation

## 2012-01-05 MED ORDER — METHOCARBAMOL 500 MG PO TABS: ORAL_TABLET | ORAL | Status: DC

## 2012-01-05 MED ORDER — HYDROCODONE-ACETAMINOPHEN 7.5-325 MG PO TABS: 1.0000 | ORAL_TABLET | ORAL | Status: DC | PRN

## 2012-01-05 NOTE — ED Notes (Signed)
Pt presents with neck pain that he awoke with this morning. Pt Denies injury.

## 2012-01-05 NOTE — ED Notes (Signed)
Lt side of neck and shoulder pain, on awakening today, increases with movement

## 2012-01-05 NOTE — ED Provider Notes (Signed)
History     CSN: [id number]  Arrival date & time 01/05/12  1606   First MD Initiated Contact with Patient 01/05/12 1707      Chief Complaint  Patient presents with  . Neck Pain    (Consider location/radiation/quality/duration/timing/severity/associated sxs/prior treatment) Patient is a 51 y.o. male presenting with neck pain. The history is provided by the patient.  Neck Pain  This is a new problem. The current episode started 3 to 5 hours ago. The problem occurs constantly. The problem has not changed since onset.The pain is associated with an unknown (Pt states he awakened with right neck and shoulder pain. No reported injury or trauma.) factor. There has been no fever. The pain is present in the right side. The quality of the pain is described as cramping. The pain radiates to the right shoulder and right scapula. The pain is moderate. Exacerbated by: Attempting to reposition the neck. The pain is the same all the time. Pertinent negatives include no photophobia, no chest pain, no numbness, no bowel incontinence and no bladder incontinence. He has tried nothing for the symptoms.    History reviewed. No pertinent past medical history.  Past Surgical History  Procedure Date  . Back surgery     History reviewed. No pertinent family history.  History  Substance Use Topics  . Smoking status: Never Smoker   . Smokeless tobacco: Not on file  . Alcohol Use: No      Review of Systems  Constitutional: Negative for activity change.       All ROS Neg except as noted in HPI  HENT: Positive for neck pain. Negative for nosebleeds.   Eyes: Negative for photophobia and discharge.  Respiratory: Negative for cough, shortness of breath and wheezing.   Cardiovascular: Negative for chest pain and palpitations.  Gastrointestinal: Negative for abdominal pain, blood in stool and bowel incontinence.  Genitourinary: Negative for bladder incontinence, dysuria, frequency and hematuria.    Musculoskeletal: Positive for back pain. Negative for arthralgias.  Skin: Negative.   Neurological: Negative for dizziness, seizures, speech difficulty and numbness.  Psychiatric/Behavioral: Negative for hallucinations and confusion.    Allergies  Morphine and related and Sudafed  Home Medications   Current Outpatient Rx  Name Route Sig Dispense Refill  . ACETAMINOPHEN 500 MG PO TABS Oral Take 1,000 mg by mouth every 6 (six) hours as needed. For pain    . IBUPROFEN 200 MG PO TABS Oral Take 400 mg by mouth every 6 (six) hours as needed. For pain    . METOPROLOL TARTRATE 25 MG PO TABS Oral Take 25 mg by mouth 2 (two) times daily.    Marland Kitchen HYDROCODONE-ACETAMINOPHEN 7.5-325 MG PO TABS Oral Take 1 tablet by mouth every 4 (four) hours as needed for pain. 20 tablet 0  . METHOCARBAMOL 500 MG PO TABS  2 po tid for spasm 30 tablet 0    BP 138/94  Pulse 77  Temp 98.4 F (36.9 C) (Oral)  Resp 18  Ht 5\' 9"  (1.753 m)  Wt 190 lb (86.183 kg)  BMI 28.06 kg/m2  SpO2 100%  Physical Exam  Nursing note and vitals reviewed. Constitutional: He is oriented to person, place, and time. He appears well-developed and well-nourished.  Non-toxic appearance.  HENT:  Head: Normocephalic.  Right Ear: Tympanic membrane and external ear normal.  Left Ear: Tympanic membrane and external ear normal.  Eyes: EOM and lids are normal. Pupils are equal, round, and reactive to light.  Neck: Normal range of  motion. Neck supple. Carotid bruit is not present.       Right trapezius spasm to palpation. Pain with attempted ROM.   Cardiovascular: Normal rate, regular rhythm, normal heart sounds, intact distal pulses and normal pulses.   Pulmonary/Chest: Breath sounds normal. No respiratory distress.  Abdominal: Soft. Bowel sounds are normal. There is no tenderness. There is no guarding.  Musculoskeletal: Normal range of motion.  Lymphadenopathy:       Head (right side): No submandibular adenopathy present.       Head  (left side): No submandibular adenopathy present.    He has no cervical adenopathy.  Neurological: He is alert and oriented to person, place, and time. He has normal strength. No cranial nerve deficit or sensory deficit.  Skin: Skin is warm and dry.  Psychiatric: He has a normal mood and affect. His speech is normal.    ED Course  Procedures (including critical care time)  Labs Reviewed - No data to display No results found.   1. Torticollis       MDM  I have reviewed nursing notes, vital signs, and all appropriate lab and imaging results for this patient. Exam is consistent with Torticollis on the right. Pt is treated with heat, ibuprofen, robaxin and norco. Pt to see his MD or orthopedic MD of choice if not improving.       Kathie Dike, [place] 01/05/12 1722

## 2012-01-07 NOTE — ED Provider Notes (Signed)
Medical screening examination/treatment/procedure(s) were performed by non-physician practitioner and as supervising physician I was immediately available for consultation/collaboration.   Faria Casella M Zechariah Bissonnette, DO 01/07/12 1219 

## 2012-09-10 ENCOUNTER — Emergency Department (HOSPITAL_COMMUNITY): Payer: Medicare Other

## 2012-09-10 ENCOUNTER — Emergency Department (HOSPITAL_COMMUNITY)
Admission: EM | Admit: 2012-09-10 | Discharge: 2012-09-10 | Disposition: A | Discharge status: Home / Self Care | Payer: Medicare Other | Attending: Emergency Medicine | Admitting: Emergency Medicine

## 2012-09-10 ENCOUNTER — Other Ambulatory Visit: Payer: Self-pay

## 2012-09-10 ENCOUNTER — Encounter (HOSPITAL_COMMUNITY): Payer: Self-pay | Admitting: *Deleted

## 2012-09-10 DIAGNOSIS — Z79899 Other long term (current) drug therapy: Secondary | ICD-10-CM | POA: Insufficient documentation

## 2012-09-10 DIAGNOSIS — R5381 Other malaise: Secondary | ICD-10-CM | POA: Insufficient documentation

## 2012-09-10 DIAGNOSIS — F17200 Nicotine dependence, unspecified, uncomplicated: Secondary | ICD-10-CM | POA: Insufficient documentation

## 2012-09-10 DIAGNOSIS — R4789 Other speech disturbances: Secondary | ICD-10-CM | POA: Insufficient documentation

## 2012-09-10 DIAGNOSIS — K219 Gastro-esophageal reflux disease without esophagitis: Secondary | ICD-10-CM | POA: Insufficient documentation

## 2012-09-10 DIAGNOSIS — I10 Essential (primary) hypertension: Secondary | ICD-10-CM | POA: Insufficient documentation

## 2012-09-10 HISTORY — DX: Gastro-esophageal reflux disease without esophagitis: K21.9

## 2012-09-10 HISTORY — DX: Essential (primary) hypertension: I10

## 2012-09-10 LAB — CBC WITH DIFFERENTIAL/PLATELET
Basophils Absolute: 0 10*3/uL (ref 0.0–0.1)
HCT: 39.1 % (ref 39.0–52.0)
Hemoglobin: 13 g/dL (ref 13.0–17.0)
Lymphocytes Relative: 21 % (ref 12–46)
Lymphs Abs: 2.1 10*3/uL (ref 0.7–4.0)
Monocytes Absolute: 0.7 10*3/uL (ref 0.1–1.0)
Monocytes Relative: 7 % (ref 3–12)
Neutro Abs: 6.8 10*3/uL (ref 1.7–7.7)
WBC: 9.8 10*3/uL (ref 4.0–10.5)

## 2012-09-10 LAB — BASIC METABOLIC PANEL
BUN: 23 mg/dL (ref 6–23)
CO2: 28 mEq/L (ref 19–32)
Chloride: 102 mEq/L (ref 96–112)
Creatinine, Ser: 1.07 mg/dL (ref 0.50–1.35)

## 2012-09-10 LAB — TROPONIN I: Troponin I: <0.3 ng/mL (ref <0.30)

## 2012-09-10 MED ORDER — METOCLOPRAMIDE HCL 5 MG/ML IJ SOLN: 10.0000 mg | Freq: Once | INTRAMUSCULAR | Status: DC

## 2012-09-10 MED ORDER — DIPHENHYDRAMINE HCL 50 MG/ML IJ SOLN: 25.0000 mg | Freq: Once | INTRAMUSCULAR | Status: DC

## 2012-09-10 MED ORDER — KETOROLAC TROMETHAMINE 30 MG/ML IJ SOLN: 30.0000 mg | Freq: Once | INTRAMUSCULAR | Status: DC

## 2012-09-10 NOTE — ED Notes (Signed)
Dizziness and near syncopal episodes starting yesterday.  EMS reports bradycardia upon arrial, 50-55 bpm.  Denies cp/sob.  cbg en route 197.

## 2012-09-10 NOTE — Discharge Instructions (Signed)
Take one aspirin daily and follow up with your md next week.

## 2012-09-10 NOTE — ED Provider Notes (Signed)
History    CSN: [id number] Arrival date & time 09/10/12  1233  First MD Initiated Contact with Patient 09/10/12 1310     Chief Complaint  Patient presents with  . Bradycardia   (Consider location/radiation/quality/duration/timing/severity/associated sxs/prior Treatment) Patient is a 51 y.o. male presenting with weakness. History provided by: the pt states he feels week today.  pt had some slurred speech yesterday according to his wife. No language interpreter was used.  Weakness This is a new problem. The current episode started yesterday. The problem occurs constantly. The problem has not changed since onset.Pertinent negatives include no chest pain, no abdominal pain and no headaches. Nothing aggravates the symptoms. Nothing relieves the symptoms.   Past Medical History  Diagnosis Date  . Hypertension   . Acid reflux    Past Surgical History  Procedure Laterality Date  . Back surgery     No family history on file. History  Substance Use Topics  . Smoking status: Current Every Day Smoker    Types: Cigarettes  . Smokeless tobacco: Not on file  . Alcohol Use: No    Review of Systems  Constitutional: Negative for appetite change and fatigue.  HENT: Negative for congestion, sinus pressure and ear discharge.   Eyes: Negative for discharge.  Respiratory: Negative for cough.   Cardiovascular: Negative for chest pain.  Gastrointestinal: Negative for abdominal pain and diarrhea.  Genitourinary: Negative for frequency and hematuria.  Musculoskeletal: Negative for back pain.  Skin: Negative for rash.  Neurological: Positive for weakness. Negative for seizures and headaches.  Psychiatric/Behavioral: Negative for hallucinations.    Allergies  Morphine and related and Sudafed  Home Medications   Current Outpatient Rx  Name  Route  Sig  Dispense  Refill  . esomeprazole (NEXIUM) 40 MG capsule   Oral   Take 40 mg by mouth daily before breakfast.         . ibuprofen  (ADVIL,MOTRIN) 200 MG tablet   Oral   Take 600 mg by mouth every 6 (six) hours as needed. For pain         . metoprolol tartrate (LOPRESSOR) 25 MG tablet   Oral   Take 25 mg by mouth 2 (two) times daily.         Marland Kitchen oxyCODONE-acetaminophen (PERCOCET/ROXICET) 5-325 MG per tablet   Oral   Take 1 tablet by mouth every 6 (six) hours as needed for pain.          BP 163/102  Pulse 59  Temp(Src) 98.3 F (36.8 C) (Oral)  Resp 20  Ht 5\' 9"  (1.753 m)  Wt 202 lb (91.627 kg)  BMI 29.82 kg/m2  SpO2 98% Physical Exam  Constitutional: He is oriented to person, place, and time. He appears well-developed.  HENT:  Head: Normocephalic.  Eyes: Conjunctivae and EOM are normal. No scleral icterus.  Neck: Neck supple. No thyromegaly present.  Cardiovascular: Normal rate and regular rhythm.  Exam reveals no gallop and no friction rub.   No murmur heard. Pulmonary/Chest: No stridor. He has no wheezes. He has no rales. He exhibits no tenderness.  Abdominal: He exhibits no distension. There is no tenderness. There is no rebound.  Musculoskeletal: Normal range of motion. He exhibits no edema.  Lymphadenopathy:    He has no cervical adenopathy.  Neurological: He is oriented to person, place, and time. Coordination normal.  Skin: No rash noted. No erythema.  Psychiatric: He has a normal mood and affect. His behavior is normal.    ED  Course  Procedures (including critical care time) Labs Reviewed  BASIC METABOLIC PANEL - Abnormal; Notable for the following:    Sodium 134 (*)    GFR calc non Af Amer 77 (*)    GFR calc Af Amer 90 (*)    All other components within normal limits  CBC WITH DIFFERENTIAL  TROPONIN I   Ct Head Wo Contrast  09/10/2012   *RADIOLOGY REPORT*  Clinical Data:  Weakness and syncope  CT HEAD WITHOUT CONTRAST  Technique:  Contiguous axial images were obtained from the base of the skull through the vertex without contrast.  Comparison: 05/20/2007  Findings: The brain has a  normal appearance without evidence for hemorrhage, infarction, hydrocephalus, or mass lesion.  There is no extra axial fluid collection.  The skull and paranasal sinuses are normal.  IMPRESSION:  1.  Normal exam.   Original Report Authenticated By: Signa Kell, M.D.   Dg Chest Portable 1 View  09/10/2012   *RADIOLOGY REPORT*  Clinical Data: Bradycardia  PORTABLE CHEST - 1 VIEW  Comparison: 09/24/2008  Findings: Chronic interstitial markings.  No focal consolidation. No pleural effusion or pneumothorax.  Mild cardiomegaly.  IMPRESSION: No evidence of acute cardiopulmonary disease.   Original Report Authenticated By: Charline Bills, M.D.   1. Hypertension     Date: 09/10/2012  Rate: 46  Rhythm: sinus bradycardia  QRS Axis: normal  Intervals: normal  ST/T Wave abnormalities: normal  Conduction Disutrbances:none  Narrative Interpretation:   Old EKG Reviewed: none available   MDM  Weakness unknown cause,  Bradycardia from beta blocker.  Possible tia the day before coming to er.  Pt to start aspirin   Benny Lennert, MD 09/10/12 [phone]

## 2012-10-20 ENCOUNTER — Encounter: Payer: Self-pay | Admitting: Cardiovascular Disease

## 2012-10-20 ENCOUNTER — Ambulatory Visit (INDEPENDENT_AMBULATORY_CARE_PROVIDER_SITE_OTHER): Payer: Medicare Other | Admitting: Cardiovascular Disease

## 2012-10-20 VITALS — BP 123/83 | HR 83 | Ht 69.0 in | Wt 199.0 lb

## 2012-10-20 DIAGNOSIS — R001 Bradycardia, unspecified: Secondary | ICD-10-CM | POA: Insufficient documentation

## 2012-10-20 DIAGNOSIS — I10 Essential (primary) hypertension: Secondary | ICD-10-CM

## 2012-10-20 DIAGNOSIS — Z716 Tobacco abuse counseling: Secondary | ICD-10-CM | POA: Insufficient documentation

## 2012-10-20 DIAGNOSIS — Z7189 Other specified counseling: Secondary | ICD-10-CM

## 2012-10-20 DIAGNOSIS — I498 Other specified cardiac arrhythmias: Secondary | ICD-10-CM

## 2012-10-20 DIAGNOSIS — F17200 Nicotine dependence, unspecified, uncomplicated: Secondary | ICD-10-CM

## 2012-10-20 NOTE — Progress Notes (Signed)
Patient ID: William Burch, male   DOB: [birth year], 52 y.o.   MRN: [id number]    CARDIOLOGY CONSULT NOTE  Patient ID: William Burch MRN: [id number] DOB/AGE: Aug 06, 1960 52 y.o.    HPI: William Burch has a h/o HTN, tobacco abuse, and GERD, who presented to the ED with some slurred speech in early July and was found to be bradycardic (sinus bradycardia by ECG), with an HR of 46 bpm. A head CT was done which was normal, as there was concern for CVA. He had been on Metoprolol twice daily at that time, which was discontinued.  The patient denies any symptoms of chest pain, palpitations, shortness of breath, lightheadedness, dizziness, leg swelling, orthopnea, PND, and syncope.  He walks a mile daily. He denies alcohol use.   He has 4 children, 2 of whom are adopted.  His anxiety is under better control.    Review of systems complete and found to be negative unless listed above in HPI  Past Medical History: See HPI  No family history on file.  History   Social History  . Marital Status: Single    Spouse Name: N/A    Number of Children: N/A  . Years of Education: N/A   Occupational History  . Not on file.   Social History Main Topics  . Smoking status: Current Every Day Smoker    Types: Cigarettes  . Smokeless tobacco: Not on file  . Alcohol Use: No  . Drug Use: No  . Sexual Activity: Not on file   Other Topics Concern  . Not on file   Social History Narrative  . No narrative on file      Physical exam Blood pressure 123/83, pulse 83, height 5\' 9"  (1.753 m), weight 199 lb (90.266 kg). General: NAD Neck: No JVD, no thyromegaly or thyroid nodule.  Lungs: Clear to auscultation bilaterally with normal respiratory effort. CV: Nondisplaced PMI.  Heart regular S1/S2, no S3/S4, no murmur.  No peripheral edema.  No carotid bruit.  Normal pedal pulses.  Abdomen: Soft, nontender, no hepatosplenomegaly, no distention.  Skin: Intact without lesions or rashes.  Neurologic:  Alert and oriented x 3.  Psych: Normal affect. Extremities: No clubbing or cyanosis.  HEENT: Normal.   Labs:   Lab Results  Component Value Date   WBC 9.8 09/10/2012   HGB 13.0 09/10/2012   HCT 39.1 09/10/2012   MCV 88.7 09/10/2012   PLT 271 09/10/2012   No results found for this basename: NA, K, CL, CO2, BUN, CREATININE, CALCIUM, LABALBU, PROT, BILITOT, ALKPHOS, ALT, AST, GLUCOSE,  in the last 168 hours Lab Results  Component Value Date   TROPONINI <0.30 09/10/2012    No results found for this basename: CHOL   No results found for this basename: HDL   No results found for this basename: LDLCALC   No results found for this basename: TRIG   No results found for this basename: CHOLHDL   No results found for this basename: LDLDIRECT       EKG: Sinus bradycardia, 46 bpm, axis within normal limits, intervals within normal limits, no acute ST-T wave changes.  Radiology: Head CT was normal in July 2014   ASSESSMENT AND PLAN:  1. Bradycardia: resolved off of Metoprolol. Pt is asymptomatic. Echo from 2008 showed hyperdynamic function while the patient was in sinus tachycardia. No indication for further non-invasive testing. 2. HTN: controlled on Amlodipine and Losartan. Encouraged dietary modification, exercise, and tobacco cessation. 3. Tobacco abuse: cessation encouraged.  Signed: Prentice Docker, M.D., F.A.C.C. 10/20/2012, 8:48 AM

## 2012-10-20 NOTE — Patient Instructions (Signed)
Continue all current medications. Follow up as needed  

## 2013-04-18 ENCOUNTER — Encounter (HOSPITAL_COMMUNITY): Payer: Self-pay | Admitting: Emergency Medicine

## 2013-04-18 ENCOUNTER — Emergency Department (HOSPITAL_COMMUNITY): Payer: Medicare Other

## 2013-04-18 ENCOUNTER — Inpatient Hospital Stay (HOSPITAL_COMMUNITY)
Admission: EM | Admit: 2013-04-18 | Discharge: 2013-04-20 | DRG: 175 | Disposition: A | Discharge status: Home / Self Care | Payer: Medicare Other | Attending: Internal Medicine | Admitting: Internal Medicine

## 2013-04-18 DIAGNOSIS — K219 Gastro-esophageal reflux disease without esophagitis: Secondary | ICD-10-CM | POA: Diagnosis present

## 2013-04-18 DIAGNOSIS — J1100 Influenza due to unidentified influenza virus with unspecified type of pneumonia: Secondary | ICD-10-CM | POA: Diagnosis present

## 2013-04-18 DIAGNOSIS — J9600 Acute respiratory failure, unspecified whether with hypoxia or hypercapnia: Secondary | ICD-10-CM | POA: Diagnosis present

## 2013-04-18 DIAGNOSIS — F17200 Nicotine dependence, unspecified, uncomplicated: Secondary | ICD-10-CM | POA: Diagnosis present

## 2013-04-18 DIAGNOSIS — M545 Low back pain, unspecified: Secondary | ICD-10-CM | POA: Diagnosis present

## 2013-04-18 DIAGNOSIS — I10 Essential (primary) hypertension: Secondary | ICD-10-CM | POA: Diagnosis present

## 2013-04-18 DIAGNOSIS — E876 Hypokalemia: Secondary | ICD-10-CM | POA: Diagnosis present

## 2013-04-18 DIAGNOSIS — J189 Pneumonia, unspecified organism: Secondary | ICD-10-CM

## 2013-04-18 DIAGNOSIS — I2699 Other pulmonary embolism without acute cor pulmonale: Principal | ICD-10-CM | POA: Diagnosis present

## 2013-04-18 DIAGNOSIS — Z800 Family history of malignant neoplasm of digestive organs: Secondary | ICD-10-CM

## 2013-04-18 DIAGNOSIS — J9601 Acute respiratory failure with hypoxia: Secondary | ICD-10-CM

## 2013-04-18 DIAGNOSIS — Z716 Tobacco abuse counseling: Secondary | ICD-10-CM

## 2013-04-18 DIAGNOSIS — G8929 Other chronic pain: Secondary | ICD-10-CM | POA: Diagnosis present

## 2013-04-18 DIAGNOSIS — N289 Disorder of kidney and ureter, unspecified: Secondary | ICD-10-CM | POA: Diagnosis present

## 2013-04-18 DIAGNOSIS — Z23 Encounter for immunization: Secondary | ICD-10-CM

## 2013-04-18 DIAGNOSIS — J111 Influenza due to unidentified influenza virus with other respiratory manifestations: Secondary | ICD-10-CM

## 2013-04-18 LAB — CBC WITH DIFFERENTIAL/PLATELET
BASOS ABS: 0 10*3/uL (ref 0.0–0.1)
Basophils Relative: 0 % (ref 0–1)
EOS ABS: 0 10*3/uL (ref 0.0–0.7)
EOS PCT: 0 % (ref 0–5)
HCT: 31.2 % — ABNORMAL LOW (ref 39.0–52.0)
Hemoglobin: 10.5 g/dL — ABNORMAL LOW (ref 13.0–17.0)
LYMPHS ABS: 1.6 10*3/uL (ref 0.7–4.0)
LYMPHS PCT: 16 % (ref 12–46)
MCH: 29.1 pg (ref 26.0–34.0)
MCHC: 33.7 g/dL (ref 30.0–36.0)
MCV: 86.4 fL (ref 78.0–100.0)
Monocytes Absolute: 0.9 10*3/uL (ref 0.1–1.0)
Monocytes Relative: 9 % (ref 3–12)
NEUTROS PCT: 76 % (ref 43–77)
Neutro Abs: 7.5 10*3/uL (ref 1.7–7.7)
PLATELETS: 257 10*3/uL (ref 150–400)
RBC: 3.61 MIL/uL — AB (ref 4.22–5.81)
RDW: 14 % (ref 11.5–15.5)
WBC: 10 10*3/uL (ref 4.0–10.5)

## 2013-04-18 LAB — URINALYSIS, ROUTINE W REFLEX MICROSCOPIC
Bilirubin Urine: NEGATIVE
GLUCOSE, UA: NEGATIVE mg/dL
Ketones, ur: NEGATIVE mg/dL
Leukocytes, UA: NEGATIVE
Nitrite: NEGATIVE
Protein, ur: NEGATIVE mg/dL
SPECIFIC GRAVITY, URINE: 1.01 (ref 1.005–1.030)
Urobilinogen, UA: 0.2 mg/dL (ref 0.0–1.0)
pH: 5.5 (ref 5.0–8.0)

## 2013-04-18 LAB — BASIC METABOLIC PANEL
BUN: 22 mg/dL (ref 6–23)
CALCIUM: 8.3 mg/dL — AB (ref 8.4–10.5)
CO2: 28 mEq/L (ref 19–32)
CREATININE: 1.37 mg/dL — AB (ref 0.50–1.35)
Chloride: 95 mEq/L — ABNORMAL LOW (ref 96–112)
GFR, EST AFRICAN AMERICAN: 66 mL/min — AB (ref >90)
GFR, EST NON AFRICAN AMERICAN: 57 mL/min — AB (ref >90)
Glucose, Bld: 125 mg/dL — ABNORMAL HIGH (ref 70–99)
Potassium: 3.2 mEq/L — ABNORMAL LOW (ref 3.7–5.3)
SODIUM: 134 meq/L — AB (ref 137–147)

## 2013-04-18 LAB — COMPREHENSIVE METABOLIC PANEL
ALK PHOS: 69 U/L (ref 39–117)
ALT: 19 U/L (ref 0–53)
AST: 36 U/L (ref 0–37)
Albumin: 3.3 g/dL — ABNORMAL LOW (ref 3.5–5.2)
BUN: 26 mg/dL — ABNORMAL HIGH (ref 6–23)
CALCIUM: 8.8 mg/dL (ref 8.4–10.5)
CO2: 26 mEq/L (ref 19–32)
Chloride: 92 mEq/L — ABNORMAL LOW (ref 96–112)
Creatinine, Ser: 1.43 mg/dL — ABNORMAL HIGH (ref 0.50–1.35)
GFR calc Af Amer: 63 mL/min — ABNORMAL LOW (ref >90)
GFR calc non Af Amer: 54 mL/min — ABNORMAL LOW (ref >90)
Glucose, Bld: 140 mg/dL — ABNORMAL HIGH (ref 70–99)
POTASSIUM: 3.5 meq/L — AB (ref 3.7–5.3)
SODIUM: 132 meq/L — AB (ref 137–147)
TOTAL PROTEIN: 8.1 g/dL (ref 6.0–8.3)
Total Bilirubin: 0.3 mg/dL (ref 0.3–1.2)

## 2013-04-18 LAB — LIPASE, BLOOD: Lipase: 9 U/L — ABNORMAL LOW (ref 11–59)

## 2013-04-18 LAB — URINE MICROSCOPIC-ADD ON

## 2013-04-18 LAB — PRO B NATRIURETIC PEPTIDE: PRO B NATRI PEPTIDE: 40.5 pg/mL (ref 0–125)

## 2013-04-18 MED ORDER — SODIUM CHLORIDE 0.9 % IV BOLUS (SEPSIS)
1000.0000 mL | Freq: Once | INTRAVENOUS | Status: AC
  Administered 2013-04-18: 1000 mL via INTRAVENOUS

## 2013-04-18 MED ORDER — HYDROMORPHONE HCL PF 1 MG/ML IJ SOLN
1.0000 mg | Freq: Once | INTRAMUSCULAR | Status: AC
  Administered 2013-04-18: 1 mg via INTRAVENOUS
  Filled 2013-04-18: qty 1

## 2013-04-18 MED ORDER — IOHEXOL 350 MG/ML SOLN
100.0000 mL | Freq: Once | INTRAVENOUS | Status: AC | PRN
  Administered 2013-04-18: 100 mL via INTRAVENOUS

## 2013-04-18 MED ORDER — SODIUM CHLORIDE 0.9 % IV BOLUS (SEPSIS)
250.0000 mL | Freq: Once | INTRAVENOUS | Status: AC
  Administered 2013-04-18: 250 mL via INTRAVENOUS

## 2013-04-18 MED ORDER — SODIUM CHLORIDE 0.9 % IV SOLN
INTRAVENOUS | Status: DC
  Administered 2013-04-18: via INTRAVENOUS

## 2013-04-18 MED ORDER — ONDANSETRON HCL 4 MG/2ML IJ SOLN
4.0000 mg | Freq: Once | INTRAMUSCULAR | Status: AC
Start: 2013-04-18 — End: 2013-04-18
  Administered 2013-04-18: 4 mg via INTRAVENOUS
  Filled 2013-04-18: qty 2

## 2013-04-18 NOTE — ED Notes (Signed)
ruq  Pain times 3 days.  Went to pcp today and was told he may have pneumonia.

## 2013-04-18 NOTE — ED Notes (Signed)
Also states he has vomited three times and had a fever.  Pt is seen at a pain clinic.

## 2013-04-18 NOTE — ED Notes (Signed)
Room air sats 87%. Placed on 2l [place]. sats up to 91%.

## 2013-04-18 NOTE — ED Provider Notes (Signed)
CSN: [id number]     Arrival date & time 04/18/13  1756 History  This chart was scribed for Shelda JakesScott W. Raydell Maners, MD by Quintella ReichertMatthew Underwood, ED scribe.  This patient was seen in room APA09/APA09 and the patient's care was started at 6:26 PM.   Chief Complaint  Patient presents with  . Chest Pain    Patient is a 53 y.o. male presenting with chest pain. The history is provided by the patient. No language interpreter was used.  Chest Pain Chest pain location: Right rib margin. Pain severity:  Severe Progression:  Worsening Chronicity:  New Worsened by:  Coughing Associated symptoms: cough, fever, headache and shortness of breath   Associated symptoms: no abdominal pain and no back pain     HPI Comments: Cristina GongHenry O Goodridge is a 53 y.o. male who presents to the Emergency Department complaining of severe, progressively-worsening right-sided rib pain that began 3 days ago.  Pt localizes his pain around his right rib margin.  He also complains of associated cough which worsens his pain.  He rates pain at 9/10 currently.  He also notes some nausea and 3 episodes of vomiting over the past 4 days.  He also complains of fevers and chills.  He denies diarrhea.  PCP is Dr. Lysbeth GalasNyland   Past Medical History  Diagnosis Date  . Hypertension   . Acid reflux     Past Surgical History  Procedure Laterality Date  . Back surgery      History reviewed. No pertinent family history.   History  Substance Use Topics  . Smoking status: Current Every Day Smoker    Types: Cigarettes  . Smokeless tobacco: Not on file  . Alcohol Use: No    Review of Systems  Constitutional: Positive for fever and chills.  HENT: Positive for congestion. Negative for rhinorrhea and sore throat.   Eyes: Negative for visual disturbance.  Respiratory: Positive for cough and shortness of breath.   Cardiovascular: Positive for chest pain. Negative for leg swelling.  Gastrointestinal: Negative for abdominal pain.  Genitourinary:  Negative for dysuria.  Musculoskeletal: Negative for back pain and neck pain.  Skin: Negative for rash.  Neurological: Positive for headaches.  Hematological: Does not bruise/bleed easily.  Psychiatric/Behavioral: Negative for confusion.     Allergies  Morphine and related and Sudafed  Home Medications   Current Outpatient Rx  Name  Route  Sig  Dispense  Refill  . esomeprazole (NEXIUM) 20 MG capsule   Oral   Take 20 mg by mouth daily at 12 noon.         . fentaNYL (DURAGESIC - DOSED MCG/HR) 25 MCG/HR patch   Transdermal   Place 1 patch onto the skin every 3 (three) days.         Marland Kitchen. levofloxacin (LEVAQUIN) 500 MG tablet   Oral   Take 1 tablet by mouth daily. For 10 days started 04/17/13         . losartan-hydrochlorothiazide (HYZAAR) 100-25 MG per tablet   Oral   Take 1 tablet by mouth daily.         . metoprolol tartrate (LOPRESSOR) 25 MG tablet   Oral   Take 1 tablet by mouth daily as needed. bp         . oxyCODONE-acetaminophen (PERCOCET) 10-325 MG per tablet   Oral   Take 1 tablet by mouth every 4 (four) hours as needed for pain.         Marland Kitchen. zolpidem (AMBIEN) 10 MG tablet  Oral   Take 10 mg by mouth at bedtime.          BP 165/86  Pulse 109  Temp(Src) 98.7 F (37.1 C) (Oral)  Resp 24  Ht 5\' 9"  (1.753 m)  Wt 217 lb (98.431 kg)  BMI 32.03 kg/m2  SpO2 93%  Physical Exam  Nursing note and vitals reviewed. Constitutional: He is oriented to person, place, and time. He appears well-developed and well-nourished. No distress.  HENT:  Head: Normocephalic and atraumatic.  Eyes: EOM are normal.  Neck: Neck supple. No tracheal deviation present.  Cardiovascular: Normal rate and regular rhythm.   Pulmonary/Chest: Effort normal and breath sounds normal. No respiratory distress. He has no wheezes. He has no rales.  Reproducible tenderness along right lower ribs  Abdominal: Bowel sounds are normal. There is no tenderness.  Musculoskeletal: Normal range of  motion. He exhibits no edema.  Neurological: He is alert and oriented to person, place, and time. No cranial nerve deficit.  Skin: Skin is warm and dry.  Psychiatric: He has a normal mood and affect. His behavior is normal.    ED Course  Procedures (including critical care time)  DIAGNOSTIC STUDIES: Oxygen Saturation is 99% on room air, normal by my interpretation.    COORDINATION OF CARE: 6:31 PM-Discussed treatment plan which includes pain medication, labs and CXR with pt at bedside and pt agreed to plan.   10:43 PM-Pt's initial creatinine is 1.43.  Creatinine appears to be elevated due to dehydration.  Pt is having pleuritic chest pain, so will rehydrate and check creatinine again, and if back to normal after hydration will order CT-angio to rule out PE.     Labs Review Labs Reviewed  CBC WITH DIFFERENTIAL - Abnormal; Notable for the following:    RBC 3.61 (*)    Hemoglobin 10.5 (*)    HCT 31.2 (*)    All other components within normal limits  COMPREHENSIVE METABOLIC PANEL - Abnormal; Notable for the following:    Sodium 132 (*)    Potassium 3.5 (*)    Chloride 92 (*)    Glucose, Bld 140 (*)    BUN 26 (*)    Creatinine, Ser 1.43 (*)    Albumin 3.3 (*)    GFR calc non Af Amer 54 (*)    GFR calc Af Amer 63 (*)    All other components within normal limits  URINALYSIS, ROUTINE W REFLEX MICROSCOPIC - Abnormal; Notable for the following:    Hgb urine dipstick SMALL (*)    All other components within normal limits  LIPASE, BLOOD - Abnormal; Notable for the following:    Lipase 9 (*)    All other components within normal limits  PRO B NATRIURETIC PEPTIDE  URINE MICROSCOPIC-ADD ON  BASIC METABOLIC PANEL   Results for orders placed during the hospital encounter of 04/18/13  CBC WITH DIFFERENTIAL      Result Value Range   WBC 10.0  4.0 - 10.5 K/uL   RBC 3.61 (*) 4.22 - 5.81 MIL/uL   Hemoglobin 10.5 (*) 13.0 - 17.0 g/dL   HCT 81.1 (*) 91.4 - 78.2 %   MCV 86.4  78.0 -  100.0 fL   MCH 29.1  26.0 - 34.0 pg   MCHC 33.7  30.0 - 36.0 g/dL   RDW 95.6  21.3 - 08.6 %   Platelets 257  150 - 400 K/uL   Neutrophils Relative % 76  43 - 77 %   Neutro Abs 7.5  1.7 - 7.7 K/uL   Lymphocytes Relative 16  12 - 46 %   Lymphs Abs 1.6  0.7 - 4.0 K/uL   Monocytes Relative 9  3 - 12 %   Monocytes Absolute 0.9  0.1 - 1.0 K/uL   Eosinophils Relative 0  0 - 5 %   Eosinophils Absolute 0.0  0.0 - 0.7 K/uL   Basophils Relative 0  0 - 1 %   Basophils Absolute 0.0  0.0 - 0.1 K/uL  COMPREHENSIVE METABOLIC PANEL      Result Value Range   Sodium 132 (*) 137 - 147 mEq/L   Potassium 3.5 (*) 3.7 - 5.3 mEq/L   Chloride 92 (*) 96 - 112 mEq/L   CO2 26  19 - 32 mEq/L   Glucose, Bld 140 (*) 70 - 99 mg/dL   BUN 26 (*) 6 - 23 mg/dL   Creatinine, Ser 1.61 (*) 0.50 - 1.35 mg/dL   Calcium 8.8  8.4 - 09.6 mg/dL   Total Protein 8.1  6.0 - 8.3 g/dL   Albumin 3.3 (*) 3.5 - 5.2 g/dL   AST 36  0 - 37 U/L   ALT 19  0 - 53 U/L   Alkaline Phosphatase 69  39 - 117 U/L   Total Bilirubin 0.3  0.3 - 1.2 mg/dL   GFR calc non Af Amer 54 (*) >90 mL/min   GFR calc Af Amer 63 (*) >90 mL/min  URINALYSIS, ROUTINE W REFLEX MICROSCOPIC      Result Value Range   Color, Urine YELLOW  YELLOW   APPearance CLEAR  CLEAR   Specific Gravity, Urine 1.010  1.005 - 1.030   pH 5.5  5.0 - 8.0   Glucose, UA NEGATIVE  NEGATIVE mg/dL   Hgb urine dipstick SMALL (*) NEGATIVE   Bilirubin Urine NEGATIVE  NEGATIVE   Ketones, ur NEGATIVE  NEGATIVE mg/dL   Protein, ur NEGATIVE  NEGATIVE mg/dL   Urobilinogen, UA 0.2  0.0 - 1.0 mg/dL   Nitrite NEGATIVE  NEGATIVE   Leukocytes, UA NEGATIVE  NEGATIVE  LIPASE, BLOOD      Result Value Range   Lipase 9 (*) 11 - 59 U/L  PRO B NATRIURETIC PEPTIDE      Result Value Range   Pro B Natriuretic peptide (BNP) 40.5  0 - 125 pg/mL  URINE MICROSCOPIC-ADD ON      Result Value Range   Squamous Epithelial / LPF RARE  RARE   RBC / HPF 0-2  <3 RBC/hpf   Bacteria, UA RARE  RARE      Imaging Review Dg Ribs Unilateral W/chest Right  04/18/2013   CLINICAL DATA:  Right anterior rib pain, cough  EXAM: RIGHT RIBS AND CHEST - 3+ VIEW  COMPARISON:  September 10, 2012  FINDINGS: No fracture or other bone lesions are seen involving the ribs. There is no evidence of pneumothorax or pleural effusion. There is diffuse increased bilateral pulmonary interstitium. Heart size and mediastinal contours are stable.  IMPRESSION: No acute fracture dislocation of right ribs.  Diffuse increased bilateral interstitial pulmonary markings partially chronic but superimposed pulmonary edema is not excluded.   Electronically Signed   By: Sherian Rein M.D.   On: 04/18/2013 19:45    EKG Interpretation    Date/Time:  Tuesday April 18 2013 18:48:44 EST Ventricular Rate:  97 PR Interval:  158 QRS Duration: 94 QT Interval:  338 QTC Calculation: 429 R Axis:   54 Text Interpretation:  Normal sinus rhythm Normal  ECG When compared with ECG of 10-Sep-2012 12:36, Vent. rate has increased BY  51 BPM QT has lengthened Confirmed by Tianni Escamilla  MD, Trashawn Oquendo ([id number]) on 04/18/2013 6:56:33 PM            MDM   Final diagnoses:  Pleurisy  Chest wall pain    The patient was significant of right lower anterior and lateral chest pain consistent with pleuritic type chest pain may be chest wall in nature. Patient also with some marginal oxygen saturations on room air 90-91%. We will do CT angios. Opted to hydrate patient due to elevated BUN and creatinine does seem to be consistent with some dehydration. Patient is a pain management patient. Pain is been improved here a little bit of with the IV hydrocodone. CT angios will be ordered to rule out pulmonary embolus also to get a better look to see whether there is evidence of pulmonary edema or developing pneumonia.    I personally performed the services described in this documentation, which was scribed in my presence. The recorded information has been reviewed and  is accurate.     Shelda Jakes, MD 04/18/13 [phone]

## 2013-04-19 ENCOUNTER — Inpatient Hospital Stay (HOSPITAL_COMMUNITY): Payer: Medicare Other

## 2013-04-19 ENCOUNTER — Encounter (HOSPITAL_COMMUNITY): Payer: Self-pay | Admitting: Internal Medicine

## 2013-04-19 DIAGNOSIS — J9600 Acute respiratory failure, unspecified whether with hypoxia or hypercapnia: Secondary | ICD-10-CM

## 2013-04-19 DIAGNOSIS — N289 Disorder of kidney and ureter, unspecified: Secondary | ICD-10-CM | POA: Diagnosis present

## 2013-04-19 DIAGNOSIS — E876 Hypokalemia: Secondary | ICD-10-CM

## 2013-04-19 DIAGNOSIS — I10 Essential (primary) hypertension: Secondary | ICD-10-CM

## 2013-04-19 DIAGNOSIS — J9601 Acute respiratory failure with hypoxia: Secondary | ICD-10-CM | POA: Diagnosis present

## 2013-04-19 DIAGNOSIS — I2699 Other pulmonary embolism without acute cor pulmonale: Principal | ICD-10-CM

## 2013-04-19 DIAGNOSIS — J189 Pneumonia, unspecified organism: Secondary | ICD-10-CM | POA: Diagnosis present

## 2013-04-19 LAB — CBC
HCT: 29.2 % — ABNORMAL LOW (ref 39.0–52.0)
Hemoglobin: 9.8 g/dL — ABNORMAL LOW (ref 13.0–17.0)
MCH: 29.2 pg (ref 26.0–34.0)
MCHC: 33.6 g/dL (ref 30.0–36.0)
MCV: 86.9 fL (ref 78.0–100.0)
PLATELETS: 241 10*3/uL (ref 150–400)
RBC: 3.36 MIL/uL — ABNORMAL LOW (ref 4.22–5.81)
RDW: 14.1 % (ref 11.5–15.5)
WBC: 7.8 10*3/uL (ref 4.0–10.5)

## 2013-04-19 LAB — PROTIME-INR
INR: 1.06 (ref 0.00–1.49)
Prothrombin Time: 13.6 seconds (ref 11.6–15.2)

## 2013-04-19 LAB — RETICULOCYTES
RBC.: 3.36 MIL/uL — ABNORMAL LOW (ref 4.22–5.81)
Retic Count, Absolute: 30.2 10*3/uL (ref 19.0–186.0)
Retic Ct Pct: 0.9 % (ref 0.4–3.1)

## 2013-04-19 LAB — VITAMIN B12: Vitamin B-12: 246 pg/mL (ref 211–911)

## 2013-04-19 LAB — STREP PNEUMONIAE URINARY ANTIGEN: STREP PNEUMO URINARY ANTIGEN: NEGATIVE

## 2013-04-19 LAB — HIV ANTIBODY (ROUTINE TESTING W REFLEX): HIV: NONREACTIVE

## 2013-04-19 LAB — FERRITIN: FERRITIN: 336 ng/mL — AB (ref 22–322)

## 2013-04-19 LAB — IRON AND TIBC
Iron: 13 ug/dL — ABNORMAL LOW (ref 42–135)
Saturation Ratios: 6 % — ABNORMAL LOW (ref 20–55)
TIBC: 222 ug/dL (ref 215–435)
UIBC: 209 ug/dL (ref 125–400)

## 2013-04-19 LAB — INFLUENZA PANEL BY PCR (TYPE A & B)
H1N1 flu by pcr: NOT DETECTED
Influenza A By PCR: NEGATIVE
Influenza B By PCR: POSITIVE — AB

## 2013-04-19 LAB — BASIC METABOLIC PANEL
BUN: 18 mg/dL (ref 6–23)
CO2: 29 meq/L (ref 19–32)
CREATININE: 1.27 mg/dL (ref 0.50–1.35)
Calcium: 8.3 mg/dL — ABNORMAL LOW (ref 8.4–10.5)
Chloride: 97 mEq/L (ref 96–112)
GFR calc Af Amer: 72 mL/min — ABNORMAL LOW (ref >90)
GFR calc non Af Amer: 62 mL/min — ABNORMAL LOW (ref >90)
GLUCOSE: 148 mg/dL — AB (ref 70–99)
Potassium: 3.6 mEq/L — ABNORMAL LOW (ref 3.7–5.3)
Sodium: 136 mEq/L — ABNORMAL LOW (ref 137–147)

## 2013-04-19 LAB — MAGNESIUM: Magnesium: 1.5 mg/dL (ref 1.5–2.5)

## 2013-04-19 LAB — FOLATE: Folate: >20 ng/mL

## 2013-04-19 LAB — TROPONIN I
Troponin I: <0.3 ng/mL (ref <0.30)
Troponin I: <0.3 ng/mL (ref <0.30)

## 2013-04-19 MED ORDER — ACETAMINOPHEN 325 MG PO TABS: 650.0000 mg | ORAL_TABLET | Freq: Four times a day (QID) | ORAL | Status: DC | PRN

## 2013-04-19 MED ORDER — ONDANSETRON HCL 4 MG PO TABS: 4.0000 mg | ORAL_TABLET | Freq: Four times a day (QID) | ORAL | Status: DC | PRN

## 2013-04-19 MED ORDER — ENOXAPARIN SODIUM 100 MG/ML ~~LOC~~ SOLN
1.0000 mg/kg | Freq: Two times a day (BID) | SUBCUTANEOUS | Status: DC
  Administered 2013-04-19: 100 mg via SUBCUTANEOUS
  Filled 2013-04-19: qty 1

## 2013-04-19 MED ORDER — FENTANYL 25 MCG/HR TD PT72
25.0000 ug | MEDICATED_PATCH | TRANSDERMAL | Status: DC
  Administered 2013-04-19: 25 ug via TRANSDERMAL
  Filled 2013-04-19: qty 1

## 2013-04-19 MED ORDER — ENOXAPARIN SODIUM 100 MG/ML ~~LOC~~ SOLN
1.0000 mg/kg | Freq: Once | SUBCUTANEOUS | Status: AC
  Administered 2013-04-19: 100 mg via SUBCUTANEOUS
  Filled 2013-04-19: qty 1

## 2013-04-19 MED ORDER — DEXTROSE 5 % IV SOLN
500.0000 mg | INTRAVENOUS | Status: DC
  Administered 2013-04-19 – 2013-04-20 (×2): 500 mg via INTRAVENOUS
  Filled 2013-04-19 (×3): qty 500

## 2013-04-19 MED ORDER — ACETAMINOPHEN 650 MG RE SUPP: 650.0000 mg | Freq: Four times a day (QID) | RECTAL | Status: DC | PRN

## 2013-04-19 MED ORDER — NICOTINE 21 MG/24HR TD PT24
21.0000 mg | MEDICATED_PATCH | Freq: Every day | TRANSDERMAL | Status: DC
  Administered 2013-04-19 – 2013-04-20 (×2): 21 mg via TRANSDERMAL
  Filled 2013-04-19 (×2): qty 1

## 2013-04-19 MED ORDER — PNEUMOCOCCAL VAC POLYVALENT 25 MCG/0.5ML IJ INJ
0.5000 mL | INJECTION | INTRAMUSCULAR | Status: AC
  Administered 2013-04-20: 0.5 mL via INTRAMUSCULAR
  Filled 2013-04-19: qty 0.5

## 2013-04-19 MED ORDER — WARFARIN - PHARMACIST DOSING INPATIENT: Status: DC

## 2013-04-19 MED ORDER — OSELTAMIVIR PHOSPHATE 75 MG PO CAPS
75.0000 mg | ORAL_CAPSULE | Freq: Two times a day (BID) | ORAL | Status: DC
  Administered 2013-04-19 – 2013-04-20 (×4): 75 mg via ORAL
  Filled 2013-04-19 (×4): qty 1

## 2013-04-19 MED ORDER — ALBUTEROL SULFATE (2.5 MG/3ML) 0.083% IN NEBU
2.5000 mg | INHALATION_SOLUTION | Freq: Four times a day (QID) | RESPIRATORY_TRACT | Status: DC | PRN
  Administered 2013-04-19: 2.5 mg via RESPIRATORY_TRACT
  Filled 2013-04-19: qty 3

## 2013-04-19 MED ORDER — POTASSIUM CHLORIDE CRYS ER 20 MEQ PO TBCR
40.0000 meq | EXTENDED_RELEASE_TABLET | Freq: Once | ORAL | Status: AC
  Administered 2013-04-19: 40 meq via ORAL
  Filled 2013-04-19: qty 2

## 2013-04-19 MED ORDER — METOPROLOL TARTRATE 25 MG PO TABS
25.0000 mg | ORAL_TABLET | Freq: Every day | ORAL | Status: DC
  Administered 2013-04-19 – 2013-04-20 (×2): 25 mg via ORAL
  Filled 2013-04-19 (×2): qty 1

## 2013-04-19 MED ORDER — PANTOPRAZOLE SODIUM 40 MG PO TBEC
40.0000 mg | DELAYED_RELEASE_TABLET | Freq: Every day | ORAL | Status: DC
  Administered 2013-04-19 – 2013-04-20 (×2): 40 mg via ORAL
  Filled 2013-04-19 (×2): qty 1

## 2013-04-19 MED ORDER — HYDROCOD POLST-CHLORPHEN POLST 10-8 MG/5ML PO LQCR
5.0000 mL | Freq: Two times a day (BID) | ORAL | Status: DC | PRN
  Administered 2013-04-19: 5 mL via ORAL
  Filled 2013-04-19: qty 5

## 2013-04-19 MED ORDER — SODIUM CHLORIDE 0.9 % IV SOLN: INTRAVENOUS | Status: AC

## 2013-04-19 MED ORDER — DEXTROSE 5 % IV SOLN
INTRAVENOUS | Status: AC
  Filled 2013-04-19: qty 500

## 2013-04-19 MED ORDER — ONDANSETRON HCL 4 MG/2ML IJ SOLN: 4.0000 mg | Freq: Four times a day (QID) | INTRAMUSCULAR | Status: DC | PRN

## 2013-04-19 MED ORDER — WARFARIN VIDEO: Freq: Once | Status: DC

## 2013-04-19 MED ORDER — GUAIFENESIN ER 600 MG PO TB12
1200.0000 mg | ORAL_TABLET | Freq: Two times a day (BID) | ORAL | Status: DC
  Administered 2013-04-19 – 2013-04-20 (×3): 1200 mg via ORAL
  Filled 2013-04-19 (×3): qty 2

## 2013-04-19 MED ORDER — HYDROMORPHONE HCL PF 1 MG/ML IJ SOLN
1.0000 mg | INTRAMUSCULAR | Status: DC | PRN
  Administered 2013-04-19: 1 mg via INTRAVENOUS
  Filled 2013-04-19: qty 1

## 2013-04-19 MED ORDER — DEXTROSE 5 % IV SOLN
1.0000 g | Freq: Once | INTRAVENOUS | Status: AC
  Administered 2013-04-19: 1 g via INTRAVENOUS
  Filled 2013-04-19: qty 10

## 2013-04-19 MED ORDER — WARFARIN SODIUM 7.5 MG PO TABS: 7.5000 mg | ORAL_TABLET | Freq: Once | ORAL | Status: DC

## 2013-04-19 MED ORDER — RIVAROXABAN 15 MG PO TABS
15.0000 mg | ORAL_TABLET | Freq: Two times a day (BID) | ORAL | Status: DC
  Administered 2013-04-19 – 2013-04-20 (×2): 15 mg via ORAL
  Filled 2013-04-19 (×2): qty 1

## 2013-04-19 MED ORDER — DEXTROSE 5 % IV SOLN: 500.0000 mg | Freq: Once | INTRAVENOUS | Status: DC

## 2013-04-19 MED ORDER — OXYCODONE-ACETAMINOPHEN 5-325 MG PO TABS
2.0000 | ORAL_TABLET | ORAL | Status: DC | PRN
  Administered 2013-04-19 – 2013-04-20 (×6): 2 via ORAL
  Filled 2013-04-19 (×6): qty 2

## 2013-04-19 MED ORDER — ZOLPIDEM TARTRATE 5 MG PO TABS
10.0000 mg | ORAL_TABLET | Freq: Every day | ORAL | Status: DC
  Administered 2013-04-19: 10 mg via ORAL
  Filled 2013-04-19: qty 2

## 2013-04-19 MED ORDER — SODIUM CHLORIDE 0.9 % IJ SOLN
3.0000 mL | Freq: Two times a day (BID) | INTRAMUSCULAR | Status: DC
  Administered 2013-04-19: 3 mL via INTRAVENOUS

## 2013-04-19 MED ORDER — PATIENT'S GUIDE TO USING COUMADIN BOOK
Freq: Once | Status: DC
  Filled 2013-04-19: qty 1

## 2013-04-19 MED ORDER — DEXTROSE 5 % IV SOLN
1.0000 g | INTRAVENOUS | Status: DC
  Administered 2013-04-19: 1 g via INTRAVENOUS
  Filled 2013-04-19 (×2): qty 10

## 2013-04-19 MED ORDER — RIVAROXABAN 20 MG PO TABS: 20.0000 mg | ORAL_TABLET | Freq: Every day | ORAL | Status: DC

## 2013-04-19 NOTE — Discharge Instructions (Signed)
Information on my medicine - XARELTO (rivaroxaban)  This medication education was reviewed with me or my healthcare representative as part of my discharge preparation.  The pharmacist that spoke with me during my hospital stay was:  Wayland DenisHall, Teriyah Purington A, [facility]  WHY WAS XARELTO PRESCRIBED FOR YOU? Xarelto was prescribed to treat blood clots that may have been found in the veins of your legs (deep vein thrombosis) or in your lungs (pulmonary embolism) and to reduce the risk of them occurring again.  What do you need to know about Xarelto? The starting dose is one 15 mg tablet taken TWICE daily with food for the FIRST 21 DAYS then on (enter date)  05/11/13  the dose is changed to one 20 mg tablet taken ONCE A DAY with your evening meal.  DO NOT stop taking Xarelto without talking to the health care provider who prescribed the medication.  Refill your prescription for 20 mg tablets before you run out.  After discharge, you should have regular check-up appointments with your healthcare provider that is prescribing your Xarelto.  In the future your dose may need to be changed if your kidney function changes by a significant amount.  What do you do if you miss a dose? If you are taking Xarelto TWICE DAILY and you miss a dose, take it as soon as you remember. You may take two 15 mg tablets (total 30 mg) at the same time then resume your regularly scheduled 15 mg twice daily the next day.  If you are taking Xarelto ONCE DAILY and you miss a dose, take it as soon as you remember on the same day then continue your regularly scheduled once daily regimen the next day. Do not take two doses of Xarelto at the same time.   Important Safety Information Xarelto is a blood thinner medicine that can cause bleeding. You should call your healthcare provider right away if you experience any of the following:   Bleeding from an injury or your nose that does not stop.   Unusual colored urine (red or dark brown) or  unusual colored stools (red or black).   Unusual bruising for unknown reasons.   A serious fall or if you hit your head (even if there is no bleeding).  Some medicines may interact with Xarelto and might increase your risk of bleeding while on Xarelto. To help avoid this, consult your healthcare provider or pharmacist prior to using any new prescription or non-prescription medications, including herbals, vitamins, non-steroidal anti-inflammatory drugs (NSAIDs) and supplements.  This website has more information on Xarelto: [email or web address].xarelto.[email or web address].

## 2013-04-19 NOTE — Progress Notes (Signed)
Patient was admitted to the hospital earlier this morning by Dr. Betti Cruzeddy.  Patient seen and examined.  He's been admitted with shortness of breath and found to have a bilateral lower lobe pneumonia, small left lower lobe pulmonary embolus as well as testing positive for influenza. Patient was started on appropriate treatment with antibiotics, anticoagulation as well as Tamiflu. He feels significantly improved at this time. He is breathing comfortably on room air is ambulating. We'll continue treatment for one more day anticipate discharge home tomorrow. He does not report any risk factors for venous thromboembolism. We'll check lower extremity venous Dopplers as well as hypercoagulable panel.  MEMON,JEHANZEB

## 2013-04-19 NOTE — Progress Notes (Signed)
ANTICOAGULATION CONSULT NOTE - Initial Consult  Pharmacy Consult for Xarelto Indication: pulmonary embolus  Allergies  Allergen Reactions  . Morphine And Related Other (See Comments)    REACTION: patient states that he was administered this medication during surgery. States that he "about died from it." patient is not sure whether the medication caused the reaction or the amount given.  William Burch. Sudafed [Pseudoephedrine Hcl] Rash   Patient Measurements: Height: 5\' 9"  (175.3 cm) Weight: 216 lb 6.4 oz (98.158 kg) IBW/kg (Calculated) : 70.7  Vital Signs:    Labs:  Recent Labs  04/18/13 1815 04/18/13 2227 04/19/13 0228 04/19/13 0318 04/19/13 0837  HGB 10.5*  --   --  9.8*  --   HCT 31.2*  --   --  29.2*  --   PLT 257  --   --  241  --   LABPROT  --   --   --  13.6  --   INR  --   --   --  1.06  --   CREATININE 1.43* 1.37*  --  1.27  --   TROPONINI  --   --  <0.30  --  <0.30    Estimated Creatinine Clearance: 76.8 ml/min (by C-G formula based on Cr of 1.27).  Medical History: Past Medical History  Diagnosis Date  . Hypertension   . Acid reflux    Medications:  Scheduled:  . azithromycin  500 mg Intravenous Once  . azithromycin  500 mg Intravenous Q24H  . cefTRIAXone (ROCEPHIN)  IV  1 g Intravenous Q24H  . fentaNYL  25 mcg Transdermal Q72H  . guaiFENesin  1,200 mg Oral BID  . metoprolol tartrate  25 mg Oral Daily  . nicotine  21 mg Transdermal Daily  . oseltamivir  75 mg Oral BID  . pantoprazole  40 mg Oral Daily  . patient's guide to using coumadin book   Does not apply Once  . [START ON 04/20/2013] pneumococcal 23 valent vaccine  0.5 mL Intramuscular Tomorrow-1000  . Rivaroxaban  15 mg Oral BID WC  . [START ON 05/11/2013] rivaroxaban  20 mg Oral Daily  . sodium chloride  3 mL Intravenous Q12H  . zolpidem  10 mg Oral QHS    Assessment: 53yo male admitted with presented to ED c/o chest pain and SOB.  Pt found to have PE.  Pt initially started on Lovenox and Coumadin  over-lap but now switching to Xarelto.  Goal of Therapy:  Full dose anticoagulation with Xarelto for PE Monitor platelets by anticoagulation protocol: Yes   Plan:  Xarelto 15mg  PO twice daily for 21 days then Xarelto 20mg  PO daily for remainder of Rx. Monitor CBC  William Burch, William Burch 04/19/2013,2:00 PM

## 2013-04-19 NOTE — Care Management Note (Signed)
    Page 1 of 1   04/19/2013     3:56:23 PM   CARE MANAGEMENT NOTE 04/19/2013  Patient:  William Burch,William Burch   Account Number:  [id number]  Date Initiated:  04/19/2013  Documentation initiated by:  Rosemary HolmsOBSON,Karlis Cregg  Subjective/Objective Assessment:   pt from home, independent with ADLs. No HH or DME anticipated     Action/Plan:   Anticipated DC Date:  04/21/2013   Anticipated DC Plan:  HOME/SELF CARE      DC Planning Services  CM consult      Choice offered to / List presented to:             Status of service:  Completed, signed off Medicare Important Message given?   (If response is "NO", the following Medicare IM given date fields will be blank) Date Medicare IM given:   Date Additional Medicare IM given:    Discharge Disposition:    Per UR Regulation:    If discussed at Long Length of Stay Meetings, dates discussed:    Comments:  04/19/13 Rosemary HolmsAmy Bessie Livingood RN BSN CM

## 2013-04-19 NOTE — H&P (Signed)
Patient's PCP: Josue Hector, MD  Chief Complaint: Shortness of breath  History of Present Illness: William Burch is a 53 y.o. Caucasian male with history of hypertension, GERD, and chronic low back pain with history of back surgery who presents with the above complaints.  Patient reports that his symptoms started 04/13/2013 with cough and shortness of breath.  Since then his cough has progressed and has been productive for yellow phlegm.  Over the last 3 days he reports having right rib pain with cough.  On 04/17/2013 he went to his primary care physician's office and was started on levofloxacin.  As his symptoms have not improved, he presented to the emergency department for further evaluation.  In the emergency department, CT of chest with contrast to 2 mm filling defect in the subsegmental pulmonary artery in the left lower lobe consistent with tiny focus of pulmonary embolism and also showed findings of multilobar pneumonitis/pneumonia.  Hospitalist service was asked to admit the patient for further care and management.  He reports feeling feverish at home.  Reports having some nausea but has not vomited.  Complaints right-sided rib pain with cough.  Denies any abdominal pain or diarrhea.  Denies any headaches or vision changes.  Review of Systems: All systems reviewed with the patient and positive as per history of present illness, otherwise all other systems are negative.  Past Medical History  Diagnosis Date  . Hypertension   . Acid reflux    Past Surgical History  Procedure Laterality Date  . Back surgery     Family History  Problem Relation Age of Onset  . Pancreatic cancer Mother   . Pancreatic cancer Father    History   Social History  . Marital Status: Divorced    Spouse Name: N/A    Number of Children: N/A  . Years of Education: N/A   Occupational History  . Not on file.   Social History Main Topics  . Smoking status: Current Every Day Smoker    Types:  Cigarettes  . Smokeless tobacco: Not on file  . Alcohol Use: No  . Drug Use: No  . Sexual Activity: Not on file   Other Topics Concern  . Not on file   Social History Narrative  . No narrative on file   Allergies: Morphine and related and Sudafed  Home Meds: Prior to Admission medications   Medication Sig Start Date End Date Taking? Authorizing Provider  esomeprazole (NEXIUM) 20 MG capsule Take 20 mg by mouth daily at 12 noon.   Yes Historical Provider, MD  fentaNYL (DURAGESIC - DOSED MCG/HR) 25 MCG/HR patch Place 1 patch onto the skin every 3 (three) days. 04/10/13  Yes Historical Provider, MD  levofloxacin (LEVAQUIN) 500 MG tablet Take 1 tablet by mouth daily. For 10 days started 04/17/13 04/17/13  Yes Historical Provider, MD  losartan-hydrochlorothiazide (HYZAAR) 100-25 MG per tablet Take 1 tablet by mouth daily. 04/03/13  Yes Historical Provider, MD  metoprolol tartrate (LOPRESSOR) 25 MG tablet Take 1 tablet by mouth daily as needed. bp 04/05/13  Yes Historical Provider, MD  oxyCODONE-acetaminophen (PERCOCET) 10-325 MG per tablet Take 1 tablet by mouth every 4 (four) hours as needed for pain.   Yes Historical Provider, MD  zolpidem (AMBIEN) 10 MG tablet Take 10 mg by mouth at bedtime.   Yes Historical Provider, MD    Physical Exam: Blood pressure 121/82, pulse 100, temperature 98.7 F (37.1 C), temperature source Oral, resp. rate 19, height 5\' 9"  (1.753 m), weight  98.431 kg (217 lb), SpO2 96.00%. General: Awake, Oriented x3, No acute distress. HEENT: EOMI, Moist mucous membranes Neck: Supple CV: S1 and S2 Lungs: Clear to ascultation bilaterally Abdomen: Soft, Nontender, Nondistended, +bowel sounds. Ext: Good pulses. Trace edema. No clubbing or cyanosis noted. Neuro: Cranial Nerves II-XII grossly intact. Has 5/5 motor strength in upper and lower extremities.  Lab results:  Recent Labs  04/18/13 1815 04/18/13 2227  NA 132* 134*  K 3.5* 3.2*  CL 92* 95*  CO2 26 28  GLUCOSE  140* 125*  BUN 26* 22  CREATININE 1.43* 1.37*  CALCIUM 8.8 8.3*    Recent Labs  04/18/13 1815  AST 36  ALT 19  ALKPHOS 69  BILITOT 0.3  PROT 8.1  ALBUMIN 3.3*    Recent Labs  04/18/13 1815  LIPASE 9*    Recent Labs  04/18/13 1815  WBC 10.0  NEUTROABS 7.5  HGB 10.5*  HCT 31.2*  MCV 86.4  PLT 257   No results found for this basename: CKTOTAL, CKMB, CKMBINDEX, TROPONINI,  in the last 72 hours No components found with this basename: POCBNP,  No results found for this basename: DDIMER,  in the last 72 hours No results found for this basename: HGBA1C,  in the last 72 hours No results found for this basename: CHOL, HDL, LDLCALC, TRIG, CHOLHDL, LDLDIRECT,  in the last 72 hours No results found for this basename: TSH, T4TOTAL, FREET3, T3FREE, THYROIDAB,  in the last 72 hours No results found for this basename: VITAMINB12, FOLATE, FERRITIN, TIBC, IRON, RETICCTPCT,  in the last 72 hours Imaging results:  Dg Ribs Unilateral W/chest Right  04/18/2013   CLINICAL DATA:  Right anterior rib pain, cough  EXAM: RIGHT RIBS AND CHEST - 3+ VIEW  COMPARISON:  September 10, 2012  FINDINGS: No fracture or other bone lesions are seen involving the ribs. There is no evidence of pneumothorax or pleural effusion. There is diffuse increased bilateral pulmonary interstitium. Heart size and mediastinal contours are stable.  IMPRESSION: No acute fracture dislocation of right ribs.  Diffuse increased bilateral interstitial pulmonary markings partially chronic but superimposed pulmonary edema is not excluded.   Electronically Signed   By: Sherian Rein M.D.   On: 04/18/2013 19:45   Ct Angio Chest Pe W/cm &/or Wo Cm  04/18/2013   CLINICAL DATA:  Cough, right chest pain  EXAM: CT ANGIOGRAPHY CHEST WITH CONTRAST  TECHNIQUE: Multidetector CT imaging of the chest was performed using the standard protocol during bolus administration of intravenous contrast. Multiplanar CT image reconstructions and MIPs were obtained  to evaluate the vascular anatomy.  CONTRAST:  OMNIPAQUE IOHEXOL 350 MG/ML SOLN  COMPARISON:  Chest x-ray April 18, 2013  FINDINGS: There is a 2 mm filling defect in a subsegmental pulmonary artery left lower lobe on image 56. The pulmonary arteries are otherwise normal. There are mild enlarged mediastinal and hilar lymphadenopathy largest measures 2.2 cm in sub carinal region. Heart size is normal. There is minimal pericardial effusion. There are patchy ground-glass opacity and small nodular opacities in bilateral lungs involving all lobes. There is no pleural effusion. Emphysematous changes of both lungs are noted. Degenerative joint changes of the spine are noted.  Review of the MIP images confirms the above findings.  IMPRESSION: 2 mm filling defect in a subsegmental pulmonary artery left lower lobe consistent with a tiny focus of pulmonary embolus.  Findings in bilateral lungs most likely due to multilobar pneumonitis/pneumonia. Enlarged mediastinal and hilar lymph nodes probably reactive.  Followup after treatment is recommended to ensure resolution.   Electronically Signed   By: Sherian ReinWei-Chen  Lin M.D.   On: 04/18/2013 23:51   Other results: EKG: Normal sinus rhythm with heart rate of 97.  Assessment & Plan by Problem: Acute respiratory failure with hypoxia due to pulmonary embolism and community-acquired pneumonia Patient started on anticoagulation with Lovenox and Coumadin.  In the morning may consider transitioning the patient to newer anticoagulant agents.  Monitor on telemetry.  Cycle cardiac enzymes.  Patient denies any recent travel, no precipitating event.  Patient for pneumonia and started on ceftriaxone and azithromycin.  Will send urine Legionella, strep pneumo, and HIV.  Will also send blood cultures.  Start patient on empiric Tamiflu, influenza PCR pending.  Hypertension Patient's losartan and hydrochlorothiazide held due to renal insufficiency.  Stable.  Continue to monitor.  Changed  patient's when necessary metoprolol to scheduled.  Mild renal insufficiency May be due to dehydration from respiratory issues.  Gently hydrate the patient on IV fluids, reassess renal function the morning.  Tobacco abuse Patient counseled on smoking cessation.  Nicotine patch prescribed.  Hypokalemia Replace as needed.  Check magnesium in the morning.  Anemia Check anemia panel in the morning.  Chronic back pain Continue home fentanyl patches.  When necessary Dilaudid.  Prophylaxis Therapeutic Lovenox.  CODE STATUS Full code.  Disposition Admit the patient as inpatient telemetry.  Time spent on admission, talking to the patient, and coordinating care was: 50 mins.  Nell Gales A, MD 04/19/2013, 12:19 AM

## 2013-04-20 DIAGNOSIS — J111 Influenza due to unidentified influenza virus with other respiratory manifestations: Secondary | ICD-10-CM

## 2013-04-20 LAB — CARDIOLIPIN ANTIBODIES, IGG, IGM, IGA
ANTICARDIOLIPIN IGA: 2 U/mL — AB (ref <22)
Anticardiolipin IgG: 21 GPL U/mL (ref <23)
Anticardiolipin IgM: 8 MPL U/mL — ABNORMAL LOW (ref <11)

## 2013-04-20 LAB — BASIC METABOLIC PANEL
BUN: 17 mg/dL (ref 6–23)
CALCIUM: 8.6 mg/dL (ref 8.4–10.5)
CO2: 27 mEq/L (ref 19–32)
CREATININE: 1.15 mg/dL (ref 0.50–1.35)
Chloride: 106 mEq/L (ref 96–112)
GFR calc Af Amer: 82 mL/min — ABNORMAL LOW (ref >90)
GFR calc non Af Amer: 70 mL/min — ABNORMAL LOW (ref >90)
Glucose, Bld: 100 mg/dL — ABNORMAL HIGH (ref 70–99)
Potassium: 4.5 mEq/L (ref 3.7–5.3)
Sodium: 143 mEq/L (ref 137–147)

## 2013-04-20 LAB — LUPUS ANTICOAGULANT PANEL
DRVVT: 52.2 secs — ABNORMAL HIGH (ref <42.9)
Lupus Anticoagulant: DETECTED — AB
PTT LA: 64.5 s — AB (ref 28.0–43.0)
PTTLA 4:1 Mix: 62 secs — ABNORMAL HIGH (ref 28.0–43.0)
PTTLA CONFIRMATION: 14.8 s — AB (ref <8.0)
dRVVT Incubated 1:1 Mix: 40.8 secs (ref <42.9)

## 2013-04-20 LAB — CBC
HEMATOCRIT: 27.7 % — AB (ref 39.0–52.0)
Hemoglobin: 9.2 g/dL — ABNORMAL LOW (ref 13.0–17.0)
MCH: 29.4 pg (ref 26.0–34.0)
MCHC: 33.2 g/dL (ref 30.0–36.0)
MCV: 88.5 fL (ref 78.0–100.0)
Platelets: 224 10*3/uL (ref 150–400)
RBC: 3.13 MIL/uL — ABNORMAL LOW (ref 4.22–5.81)
RDW: 14.3 % (ref 11.5–15.5)
WBC: 6.5 10*3/uL (ref 4.0–10.5)

## 2013-04-20 LAB — BETA-2-GLYCOPROTEIN I ABS, IGG/M/A
BETA-2-GLYCOPROTEIN I IGA: 4 A Units (ref <20)
Beta-2 Glyco I IgG: 6 G Units (ref <20)
Beta-2-Glycoprotein I IgM: 13 M Units (ref <20)

## 2013-04-20 LAB — PROTEIN C ACTIVITY: Protein C Activity: 145 % — ABNORMAL HIGH (ref 75–133)

## 2013-04-20 LAB — ANTITHROMBIN III: AntiThromb III Func: 131 % — ABNORMAL HIGH (ref 75–120)

## 2013-04-20 LAB — LEGIONELLA ANTIGEN, URINE: Legionella Antigen, Urine: NEGATIVE

## 2013-04-20 LAB — HOMOCYSTEINE: Homocysteine: 10.4 umol/L (ref 4.0–15.4)

## 2013-04-20 LAB — PROTEIN S ACTIVITY: Protein S Activity: 84 % (ref 69–129)

## 2013-04-20 LAB — PROTHROMBIN GENE MUTATION

## 2013-04-20 LAB — FACTOR 5 LEIDEN

## 2013-04-20 MED ORDER — CEFUROXIME AXETIL 500 MG PO TABS: 500.0000 mg | ORAL_TABLET | Freq: Two times a day (BID) | ORAL | Status: DC

## 2013-04-20 MED ORDER — AZITHROMYCIN 500 MG PO TABS: 500.0000 mg | ORAL_TABLET | Freq: Every day | ORAL | Status: DC

## 2013-04-20 MED ORDER — RIVAROXABAN 20 MG PO TABS: 20.0000 mg | ORAL_TABLET | Freq: Every day | ORAL | Status: DC

## 2013-04-20 MED ORDER — RIVAROXABAN 15 MG PO TABS: 15.0000 mg | ORAL_TABLET | Freq: Two times a day (BID) | ORAL | Status: DC

## 2013-04-20 MED ORDER — OSELTAMIVIR PHOSPHATE 75 MG PO CAPS: 75.0000 mg | ORAL_CAPSULE | Freq: Two times a day (BID) | ORAL | Status: DC

## 2013-04-20 NOTE — Progress Notes (Signed)
ANTIBIOTIC CONSULT NOTE - INITIAL  Pharmacy Consult for renal dose antibiotics Indication: pneumonia  Allergies  Allergen Reactions  . Morphine And Related Other (See Comments)    REACTION: patient states that he was administered this medication during surgery. States that he "about died from it." patient is not sure whether the medication caused the reaction or the amount given.  William Burch [Pseudoephedrine Hcl] Rash    Patient Measurements: Height: 5\' 9"  (175.3 cm) Weight: 216 lb 3.2 oz (98.068 kg) IBW/kg (Calculated) : 70.7  Vital Signs: Temp: 98.4 F (36.9 C) (02/12 0644) Temp src: Oral (02/12 0644) BP: 133/86 mmHg (02/12 0644) Pulse Rate: 69 (02/12 0644) Intake/Output from previous day: 02/11 0701 - 02/12 0700 In: 1250 [P.O.:950; IV Piggyback:300] Out: 2775 [Urine:2775] Intake/Output from this shift:    Labs:  Recent Labs  04/18/13 1815 04/18/13 2227 04/19/13 0318 04/20/13 0542  WBC 10.0  --  7.8 6.5  HGB 10.5*  --  9.8* 9.2*  PLT 257  --  241 224  CREATININE 1.43* 1.37* 1.27 1.15   Estimated Creatinine Clearance: 84.9 ml/min (by C-G formula based on Cr of 1.15). No results found for this basename: VANCOTROUGH, Leodis Binet, VANCORANDOM, GENTTROUGH, GENTPEAK, GENTRANDOM, TOBRATROUGH, TOBRAPEAK, TOBRARND, AMIKACINPEAK, AMIKACINTROU, AMIKACIN,  in the last 72 hours   Microbiology: Recent Results (from the past 720 hour(s))  CULTURE, BLOOD (ROUTINE X 2)     Status: None   Collection Time    04/19/13  3:18 AM      Result Value Ref Range Status   Specimen Description BLOOD LEFT ANTECUBITAL   Final   Special Requests     Final   Value: BOTTLES DRAWN AEROBIC AND ANAEROBIC AEB 5CC ANA 3CC   Culture NO GROWTH <24 HRS   Final   Report Status PENDING   Incomplete  CULTURE, BLOOD (ROUTINE X 2)     Status: None   Collection Time    04/19/13  3:18 AM      Result Value Ref Range Status   Specimen Description BLOOD LEFT HAND   Final   Special Requests     Final   Value: BOTTLES DRAWN AEROBIC AND ANAEROBIC AEB 6CC ANA 5CC   Culture NO GROWTH <24 HRS   Final   Report Status PENDING   Incomplete    Medical History: Past Medical History  Diagnosis Date  . Hypertension   . Acid reflux     Medications:  Scheduled:  . azithromycin  500 mg Intravenous Q24H  . cefTRIAXone (ROCEPHIN)  IV  1 g Intravenous Q24H  . fentaNYL  25 mcg Transdermal Q72H  . guaiFENesin  1,200 mg Oral BID  . metoprolol tartrate  25 mg Oral Daily  . nicotine  21 mg Transdermal Daily  . oseltamivir  75 mg Oral BID  . pantoprazole  40 mg Oral Daily  . patient's guide to using coumadin book   Does not apply Once  . pneumococcal 23 valent vaccine  0.5 mL Intramuscular Tomorrow-1000  . Rivaroxaban  15 mg Oral BID WC  . [START ON 05/11/2013] rivaroxaban  20 mg Oral Daily  . sodium chloride  3 mL Intravenous Q12H  . zolpidem  10 mg Oral QHS   Assessment: 53 yo M admitted with CAP, PNA, and +Flu.  He has been started on Rocephin & Zithromax for CAP & Tamiflu for Influenza.   His renal function is good.   Rocephin & Zithromax do not require renal dose reductions.   Tamiflu dose is  appropriate for CrCl >6680ml/min.    Rocephin 2/11>> Zithromax 2/11>> Tamiflu 2/11>>  Goal of Therapy:  Eradicate infection.  Plan:  Continue antibiotics doses as ordered No dose adjustments anticipated Pharmacy to sign off- please re-consult as needed  William Burch, William Burch 04/20/2013,9:06 AM

## 2013-04-20 NOTE — Progress Notes (Signed)
D/c instructions reviewed with patient.  Verbalized understanding.  Pt dc'd to home with sister. Schonewitz, Candelaria StagersLeigh Anne 04/20/2013

## 2013-04-20 NOTE — Discharge Summary (Signed)
Physician Discharge Summary  William GongHenry O Bayly NWG:[id number] DOB: 09/05/1960 DOA: 04/18/2013  PCP: Josue HectorNYLAND,LEONARD ROBERT, MD  Admit date: 04/18/2013 Discharge date: 04/20/2013  Time spent: 40 minutes  Recommendations for Outpatient Follow-up:  1. Followup with primary care physician in one week. 2. Repeat CBC and BMET when followed up by primary care physician. 3. Follow up results of the hypercoagulable panel sent in hospital  Discharge Diagnoses:  Principal Problem:   Acute respiratory failure with hypoxia Active Problems:   HTN (hypertension)   Tobacco abuse counseling   PE (pulmonary embolism)   CAP (community acquired pneumonia)   Acute respiratory failure   Hypokalemia   Renal insufficiency Influenza A  Discharge Condition: improved  Diet recommendation: low salt  Filed Weights   04/18/13 1813 04/19/13 0118 04/20/13 0600  Weight: 98.431 kg (217 lb) 98.158 kg (216 lb 6.4 oz) 98.068 kg (216 lb 3.2 oz)    History of present illness:  William GongHenry O Burch is a 53 y.o. Caucasian male with history of hypertension, GERD, and chronic low back pain with history of back surgery who presents with the above complaints. Patient reports that his symptoms started 04/13/2013 with cough and shortness of breath. Since then his cough has progressed and has been productive for yellow phlegm. Over the last 3 days he reports having right rib pain with cough. On 04/17/2013 he went to his primary care physician's office and was started on levofloxacin. As his symptoms have not improved, he presented to the emergency department for further evaluation. In the emergency department, CT of chest with contrast to 2 mm filling defect in the subsegmental pulmonary artery in the left lower lobe consistent with tiny focus of pulmonary embolism and also showed findings of multilobar pneumonitis/pneumonia. Hospitalist service was asked to admit the patient for further care and management. He reports feeling feverish at  home. Reports having some nausea but has not vomited. Complaints right-sided rib pain with cough. Denies any abdominal pain or diarrhea. Denies any headaches or vision changes.   Hospital Course:  This patient was admitted to the hospital for shortness of breath. He was found to have bilateral lower lobe pneumonia as well as left lower lobe pulmonary embolism. He also tested positive for influenza A. The patient was started on appropriate antibiotics, anticoagulation as well as Tamiflu. Clinically, the patient has done very well. He is ambulating in the hall without any shortness of breath and is breathing comfortably on room air. He'll be prescribed a outpatient course of antibiotics as well as Tamiflu. He has been placed on Xarelto for anticoagulation and will need treatment for at least the next 6 months. Hypercoagulable panel was sent in the hospital and needs to be followed up in the outpatient setting. Lower extremity venous Dopplers were unremarkable for DVT. He'll need repeat CBC and chemistry when followed up by primary care physician. He does not have any evidence of bleeding at this time. Patient is ready for discharge home.  Procedures:  none  Consultations:  None  Discharge Exam: Filed Vitals:   04/20/13 0644  BP: 133/86  Pulse: 69  Temp: 98.4 F (36.9 C)  Resp: 20    General: NAD Cardiovascular: S1, S2 RRR Respiratory: CTA B  Discharge Instructions  Discharge Orders   Future Orders Complete By Expires   Call MD for:  difficulty breathing, headache or visual disturbances  As directed    Call MD for:  difficulty breathing, headache or visual disturbances  As directed    Call  MD for:  severe uncontrolled pain  As directed    Call MD for:  severe uncontrolled pain  As directed    Call MD for:  temperature >100.4  As directed    Call MD for:  temperature >100.4  As directed    Diet - low sodium heart healthy  As directed    Diet - low sodium heart healthy  As directed     Increase activity slowly  As directed    Increase activity slowly  As directed        Medication List    STOP taking these medications       levofloxacin 500 MG tablet  Commonly known as:  LEVAQUIN      TAKE these medications       azithromycin 500 MG tablet  Commonly known as:  ZITHROMAX  Take 1 tablet (500 mg total) by mouth daily.     cefUROXime 500 MG tablet  Commonly known as:  CEFTIN  Take 1 tablet (500 mg total) by mouth 2 (two) times daily with a meal.     esomeprazole 20 MG capsule  Commonly known as:  NEXIUM  Take 20 mg by mouth daily at 12 noon.     fentaNYL 25 MCG/HR patch  Commonly known as:  DURAGESIC - dosed mcg/hr  Place 1 patch onto the skin every 3 (three) days.     losartan-hydrochlorothiazide 100-25 MG per tablet  Commonly known as:  HYZAAR  Take 1 tablet by mouth daily.     metoprolol tartrate 25 MG tablet  Commonly known as:  LOPRESSOR  Take 1 tablet by mouth daily as needed. bp     oseltamivir 75 MG capsule  Commonly known as:  TAMIFLU  Take 1 capsule (75 mg total) by mouth 2 (two) times daily.     oxyCODONE-acetaminophen 10-325 MG per tablet  Commonly known as:  PERCOCET  Take 1 tablet by mouth every 4 (four) hours as needed for pain.     Rivaroxaban 15 MG Tabs tablet  Commonly known as:  XARELTO  Take 1 tablet (15 mg total) by mouth 2 (two) times daily with a meal.     Rivaroxaban 20 MG Tabs tablet  Commonly known as:  XARELTO  Take 1 tablet (20 mg total) by mouth daily. To be started once 15mg  bid dosing is complete  Start taking on:  05/11/2013     zolpidem 10 MG tablet  Commonly known as:  AMBIEN  Take 10 mg by mouth at bedtime.       Allergies  Allergen Reactions  . Morphine And Related Other (See Comments)    REACTION: patient states that he was administered this medication during surgery. States that he "about died from it." patient is not sure whether the medication caused the reaction or the amount given.  [name]  [Pseudoephedrine Hcl] Rash       Follow-up Information   Follow up with Josue Hector, MD. Schedule an appointment as soon as possible for a visit in 1 week.   Specialty:  Family Medicine   Contact information:   [address] [place] [address] [phone]        The results of significant diagnostics from this hospitalization (including imaging, microbiology, ancillary and laboratory) are listed below for reference.    Significant Diagnostic Studies: Dg Ribs Unilateral W/chest Right  04/18/2013   CLINICAL DATA:  Right anterior rib pain, cough  EXAM: RIGHT RIBS AND CHEST - 3+ VIEW  COMPARISON:  September 10, 2012  FINDINGS: No fracture or other bone lesions are seen involving the ribs. There is no evidence of pneumothorax or pleural effusion. There is diffuse increased bilateral pulmonary interstitium. Heart size and mediastinal contours are stable.  IMPRESSION: No acute fracture dislocation of right ribs.  Diffuse increased bilateral interstitial pulmonary markings partially chronic but superimposed pulmonary edema is not excluded.   Electronically Signed   By: Sherian Rein M.D.   On: 04/18/2013 19:45   Ct Angio Chest Pe W/cm &/or Wo Cm  04/18/2013   CLINICAL DATA:  Cough, right chest pain  EXAM: CT ANGIOGRAPHY CHEST WITH CONTRAST  TECHNIQUE: Multidetector CT imaging of the chest was performed using the standard protocol during bolus administration of intravenous contrast. Multiplanar CT image reconstructions and MIPs were obtained to evaluate the vascular anatomy.  CONTRAST:  OMNIPAQUE IOHEXOL 350 MG/ML SOLN  COMPARISON:  Chest x-ray April 18, 2013  FINDINGS: There is a 2 mm filling defect in a subsegmental pulmonary artery left lower lobe on image 56. The pulmonary arteries are otherwise normal. There are mild enlarged mediastinal and hilar lymphadenopathy largest measures 2.2 cm in sub carinal region. Heart size is normal. There is minimal pericardial effusion. There are  patchy ground-glass opacity and small nodular opacities in bilateral lungs involving all lobes. There is no pleural effusion. Emphysematous changes of both lungs are noted. Degenerative joint changes of the spine are noted.  Review of the MIP images confirms the above findings.  IMPRESSION: 2 mm filling defect in a subsegmental pulmonary artery left lower lobe consistent with a tiny focus of pulmonary embolus.  Findings in bilateral lungs most likely due to multilobar pneumonitis/pneumonia. Enlarged mediastinal and hilar lymph nodes probably reactive. Followup after treatment is recommended to ensure resolution.   Electronically Signed   By: Sherian Rein M.D.   On: 04/18/2013 23:51   US Venous Img Lower Bilateral  04/19/2013   CLINICAL DATA:  Pulmonary embolism  EXAM: BILATERAL LOWER EXTREMITY VENOUS DOPPLER ULTRASOUND  TECHNIQUE: Gray-scale sonography with graded compression, as well as color Doppler and duplex ultrasound, were performed to evaluate the deep venous system from the level of the common femoral vein through the popliteal and proximal calf veins. Spectral Doppler was utilized to evaluate flow at rest and with distal augmentation maneuvers.  COMPARISON:  None  FINDINGS: Thrombus within deep veins:  None visualized.  Compressibility of deep veins:  Normal.  Duplex waveform respiratory phasicity:  Normal.  Duplex waveform response to augmentation:  Normal.  Venous reflux:  None visualized.  Other findings:  None visualized.  IMPRESSION: No evidence of deep venous thrombosis in either lower extremity.   Electronically Signed   By: Ulyses Southward M.D.   On: 04/19/2013 14:45    Microbiology: Recent Results (from the past 240 hour(s))  CULTURE, BLOOD (ROUTINE X 2)     Status: None   Collection Time    04/19/13  3:18 AM      Result Value Ref Range Status   Specimen Description BLOOD LEFT ANTECUBITAL   Final   Special Requests     Final   Value: BOTTLES DRAWN AEROBIC AND ANAEROBIC AEB=5CC ANA=6CC    Culture NO GROWTH 1 DAY   Final   Report Status PENDING   Incomplete  CULTURE, BLOOD (ROUTINE X 2)     Status: None   Collection Time    04/19/13  3:18 AM      Result Value Ref Range Status  Specimen Description BLOOD LEFT HAND   Final   Special Requests     Final   Value: BOTTLES DRAWN AEROBIC AND ANAEROBIC AEB=6CC ANA=5CC   Culture NO GROWTH 1 DAY   Final   Report Status PENDING   Incomplete     Labs: Basic Metabolic Panel:  Recent Labs Lab 04/18/13 1815 04/18/13 2227 04/19/13 0318 04/20/13 0542  NA 132* 134* 136* 143  K 3.5* 3.2* 3.6* 4.5  CL 92* 95* 97 106  CO2 26 28 29 27   GLUCOSE 140* 125* 148* 100*  BUN 26* 22 18 17   CREATININE 1.43* 1.37* 1.27 1.15  CALCIUM 8.8 8.3* 8.3* 8.6  MG  --   --  1.5  --    Liver Function Tests:  Recent Labs Lab 04/18/13 1815  AST 36  ALT 19  ALKPHOS 69  BILITOT 0.3  PROT 8.1  ALBUMIN 3.3*    Recent Labs Lab 04/18/13 1815  LIPASE 9*   No results found for this basename: AMMONIA,  in the last 168 hours CBC:  Recent Labs Lab 04/18/13 1815 04/19/13 0318 04/20/13 0542  WBC 10.0 7.8 6.5  NEUTROABS 7.5  --   --   HGB 10.5* 9.8* 9.2*  HCT 31.2* 29.2* 27.7*  MCV 86.4 86.9 88.5  PLT 257 241 224   Cardiac Enzymes:  Recent Labs Lab 04/19/13 0228 04/19/13 0837  TROPONINI <0.30 <0.30   BNP: BNP (last 3 results)  Recent Labs  04/18/13 1815  PROBNP 40.5   CBG: No results found for this basename: GLUCAP,  in the last 168 hours     Signed:  Kyung Muto  [facility] 04/20/2013, 8:10 PM

## 2013-04-21 LAB — PROTEIN S, TOTAL: Protein S Ag, Total: 102 % (ref 60–150)

## 2013-04-21 LAB — PROTEIN C, TOTAL: Protein C, Total: 83 % (ref 72–160)

## 2013-04-24 LAB — CULTURE, BLOOD (ROUTINE X 2)
CULTURE: NO GROWTH
CULTURE: NO GROWTH

## 2013-05-16 ENCOUNTER — Ambulatory Visit (HOSPITAL_COMMUNITY)
Admission: RE | Admit: 2013-05-16 | Discharge: 2013-05-16 | Disposition: A | Discharge status: Home / Self Care | Payer: Medicare Other | Source: Ambulatory Visit | Attending: Family Medicine | Admitting: Family Medicine

## 2013-05-16 ENCOUNTER — Other Ambulatory Visit (HOSPITAL_COMMUNITY): Payer: Self-pay | Admitting: Family Medicine

## 2013-05-16 DIAGNOSIS — J189 Pneumonia, unspecified organism: Secondary | ICD-10-CM

## 2013-06-01 ENCOUNTER — Encounter (INDEPENDENT_AMBULATORY_CARE_PROVIDER_SITE_OTHER): Payer: Self-pay | Admitting: *Deleted

## 2013-06-19 ENCOUNTER — Telehealth (INDEPENDENT_AMBULATORY_CARE_PROVIDER_SITE_OTHER): Payer: Self-pay | Admitting: *Deleted

## 2013-06-19 ENCOUNTER — Encounter (INDEPENDENT_AMBULATORY_CARE_PROVIDER_SITE_OTHER): Payer: Self-pay | Admitting: Internal Medicine

## 2013-06-19 ENCOUNTER — Other Ambulatory Visit (INDEPENDENT_AMBULATORY_CARE_PROVIDER_SITE_OTHER): Payer: Self-pay | Admitting: *Deleted

## 2013-06-19 ENCOUNTER — Ambulatory Visit (INDEPENDENT_AMBULATORY_CARE_PROVIDER_SITE_OTHER): Payer: Medicare Other | Admitting: Internal Medicine

## 2013-06-19 VITALS — BP 90/58 | HR 80 | Temp 97.4°F | Ht 69.0 in | Wt 206.9 lb

## 2013-06-19 DIAGNOSIS — Z1211 Encounter for screening for malignant neoplasm of colon: Secondary | ICD-10-CM

## 2013-06-19 DIAGNOSIS — D649 Anemia, unspecified: Secondary | ICD-10-CM

## 2013-06-19 DIAGNOSIS — R195 Other fecal abnormalities: Secondary | ICD-10-CM

## 2013-06-19 DIAGNOSIS — K625 Hemorrhage of anus and rectum: Secondary | ICD-10-CM

## 2013-06-19 MED ORDER — PEG-KCL-NACL-NASULF-NA ASC-C 100 G PO SOLR: 1.0000 | Freq: Once | ORAL | Status: DC

## 2013-06-19 NOTE — Progress Notes (Signed)
Subjective:     Patient ID: William Burch, male   DOB: 1960/09/11, 53 y.o.   MRN: [id number]  HPI Referred to our office for anemia by Dr. Lysbeth GalasNyland.  Noted  Hemoglobin 9.2 on 04/19/2013. Hemoglobin in July of 2014 was normal.   He also tells me he was guaiac positive in Dr Joyce CopaNyland's office. Recently at [facility] for a PE. Started on Xarelto 2 months ago for the PE. Denies prior hx of PE. Appetite has been good. No dysphagia. He does have acid reflux and takes Nexium. He takes the Nexium before he eats. He has acid reflux about every other day. No abdominal pain. He usually ha a BM about every 2 days. No melena or bright red rectal bleeding. No family hx of colon cancer. He says he had a EGD years ago but cannot tell me the results.  09/10/2012 CBC was normal.   04/19/2013 Lupus Anticoagulant panel:abnormal. Antithromb 111 abnormal.  Ferritin 336, Iron 13 TIBC 222, Vitamin B12 246  CBC    Component Value Date/Time   WBC 6.5 04/20/2013 0542   RBC 3.13* 04/20/2013 0542   RBC 3.36* 04/19/2013 0318   HGB 9.2* 04/20/2013 0542   HCT 27.7* 04/20/2013 0542   PLT 224 04/20/2013 0542   MCV 88.5 04/20/2013 0542   MCH 29.4 04/20/2013 0542   MCHC 33.2 04/20/2013 0542   RDW 14.3 04/20/2013 0542   LYMPHSABS 1.6 04/18/2013 1815   MONOABS 0.9 04/18/2013 1815   EOSABS 0.0 04/18/2013 1815   BASOSABS 0.0 04/18/2013 1815   04/23/2013 CT angio chest with CM: IMPRESSION:  2 mm filling defect in a subsegmental pulmonary artery left lower  lobe consistent with a tiny focus of pulmonary embolus.  Findings in bilateral lungs most likely due to multilobar  pneumonitis/pneumonia. Enlarged mediastinal and hilar lymph nodes  probably reactive. Followup after treatment is recommended to ensure  resolution.   Review of Systems  Past Medical History  Diagnosis Date  . Hypertension   . Acid reflux   . PE (pulmonary embolism)     in Febuary of this year    Past Surgical History  Procedure Laterality Date  . Back surgery       Allergies  Allergen Reactions  . Morphine And Related Other (See Comments)    REACTION: patient states that he was administered this medication during surgery. States that he "about died from it." patient is not sure whether the medication caused the reaction or the amount given.  [name]. Sudafed [Pseudoephedrine Hcl] Rash    Current Outpatient Prescriptions on File Prior to Visit  Medication Sig Dispense Refill  . esomeprazole (NEXIUM) 20 MG capsule Take 20 mg by mouth daily at 12 noon.      . fentaNYL (DURAGESIC - DOSED MCG/HR) 25 MCG/HR patch Place 1 patch onto the skin every 3 (three) days.      Marland Kitchen. losartan-hydrochlorothiazide (HYZAAR) 100-25 MG per tablet Take 1 tablet by mouth daily.      . metoprolol tartrate (LOPRESSOR) 25 MG tablet Take 1 tablet by mouth daily as needed. bp      . oxyCODONE-acetaminophen (PERCOCET) 10-325 MG per tablet Take 1 tablet by mouth every 4 (four) hours as needed for pain.      . Rivaroxaban (XARELTO) 20 MG TABS tablet Take 1 tablet (20 mg total) by mouth daily. To be started once 15mg  bid dosing is complete  30 tablet  4  . zolpidem (AMBIEN) 10 MG tablet Take 10 mg by mouth  at bedtime.       No current facility-administered medications on file prior to visit.         Objective:   Physical Exam Filed Vitals:   06/19/13 1000  BP: 90/58  Pulse: 80  Temp: 97.4 F (36.3 C)  Height: 5\' 9"  (1.753 m)  Weight: 206 lb 14.4 oz (93.849 kg)   Alert and oriented. Skin warm and dry. Oral mucosa is moist.   . Sclera anicteric, conjunctivae is pink. Thyroid not enlarged. No cervical lymphadenopathy. Lungs clear. Heart regular rate and rhythm.  Abdomen is soft. Bowel sounds are positive. No hepatomegaly. No abdominal masses felt. No tenderness.  No edema to lower extremities.   Stools brown and guaiac positive.    Assessment:     Anemia. ? GI source. Patient ha never undergone a colonoscopy in the past. Colonic neoplasm needs to be ruled out. PUD disease is  also in the differential.    Plan:     Colonoscopy/ EGD. The risks and benefits such as perforation, bleeding, and infection were reviewed with the patient and is agreeable.

## 2013-06-19 NOTE — Telephone Encounter (Signed)
Patient needs movi prep 

## 2013-06-19 NOTE — Patient Instructions (Signed)
Colonoscopy/EGD. The risks and benefits such as perforation, bleeding, and infection were reviewed with the patient and is agreeable. 

## 2013-06-20 ENCOUNTER — Encounter (INDEPENDENT_AMBULATORY_CARE_PROVIDER_SITE_OTHER): Payer: Self-pay

## 2013-06-20 ENCOUNTER — Encounter (HOSPITAL_COMMUNITY): Payer: Self-pay | Admitting: Pharmacy Technician

## 2013-06-26 ENCOUNTER — Encounter (HOSPITAL_COMMUNITY): Payer: Self-pay

## 2013-06-26 ENCOUNTER — Encounter (HOSPITAL_COMMUNITY)
Admission: RE | Admit: 2013-06-26 | Discharge: 2013-06-26 | Disposition: A | Discharge status: Home / Self Care | Payer: Medicare Other | Source: Ambulatory Visit | Attending: Internal Medicine | Admitting: Internal Medicine

## 2013-06-26 VITALS — BP 121/76 | HR 82 | Temp 97.8°F | Resp 20 | Ht 69.0 in | Wt 207.0 lb

## 2013-06-26 DIAGNOSIS — Z01812 Encounter for preprocedural laboratory examination: Secondary | ICD-10-CM | POA: Insufficient documentation

## 2013-06-26 DIAGNOSIS — D649 Anemia, unspecified: Secondary | ICD-10-CM

## 2013-06-26 DIAGNOSIS — R195 Other fecal abnormalities: Secondary | ICD-10-CM

## 2013-06-26 HISTORY — DX: Other chronic pain: G89.29

## 2013-06-26 HISTORY — DX: Dorsalgia, unspecified: M54.9

## 2013-06-26 HISTORY — DX: Unspecified convulsions: R56.9

## 2013-06-26 LAB — HEMOGLOBIN AND HEMATOCRIT, BLOOD
HCT: 34.3 % — ABNORMAL LOW (ref 39.0–52.0)
Hemoglobin: 11.4 g/dL — ABNORMAL LOW (ref 13.0–17.0)

## 2013-06-26 LAB — BASIC METABOLIC PANEL
BUN: 17 mg/dL (ref 6–23)
CALCIUM: 9.9 mg/dL (ref 8.4–10.5)
CO2: 27 meq/L (ref 19–32)
CREATININE: 1.09 mg/dL (ref 0.50–1.35)
Chloride: 99 mEq/L (ref 96–112)
GFR calc Af Amer: 87 mL/min — ABNORMAL LOW (ref >90)
GFR, EST NON AFRICAN AMERICAN: 75 mL/min — AB (ref >90)
GLUCOSE: 114 mg/dL — AB (ref 70–99)
Potassium: 3.7 mEq/L (ref 3.7–5.3)
SODIUM: 139 meq/L (ref 137–147)

## 2013-06-26 NOTE — Pre-Procedure Instructions (Signed)
Patient given information to sign up for my chart at home. 

## 2013-06-26 NOTE — Patient Instructions (Signed)
William Burch  06/26/2013   Your procedure is scheduled on:   07/03/2013  Report to [facility] at  615  AM.  Call this number if you have problems the morning of surgery: [phone]   Remember:   Do not eat food or drink liquids after midnight.   Take these medicines the morning of surgery with A SIP OF WATER: nexium, losartan,-metoprolol, percocet   Do not wear jewelry, make-up or nail polish.  Do not wear lotions, powders, or perfumes.   Do not shave 48 hours prior to surgery. Men may shave face and neck.  Do not bring valuables to the hospital.  [facility] any belongings or valuables.               Contacts, dentures or bridgework may not be worn into surgery.  Leave suitcase in the car. After surgery it may be brought to your room.  For patients admitted to the hospital, discharge time is determined by your treatment team.               Patients discharged the day of surgery will not be allowed to drive home.  Name and phone number of your driver: family  Special Instructions: N/A   Please read over the following fact sheets that you were given: Pain Booklet, Coughing and Deep Breathing, Surgical Site Infection Prevention, Anesthesia Post-op Instructions and Care and Recovery After Surgery Colonoscopy A colonoscopy is an exam to look at the entire large intestine (colon). This exam can help find problems such as tumors, polyps, inflammation, and areas of bleeding. The exam takes about 1 hour.  LET [facility] PROVIDER KNOW ABOUT:   Any allergies you have.  All medicines you are taking, including vitamins, herbs, eye drops, creams, and over-the-counter medicines.  Previous problems you or members of your family have had with the use of anesthetics.  Any blood disorders you have.  Previous surgeries you have had.  Medical conditions you have. RISKS AND COMPLICATIONS  Generally, this is a safe procedure. However, as with any  procedure, complications can occur. Possible complications include:  Bleeding.  Tearing or rupture of the colon wall.  Reaction to medicines given during the exam.  Infection (rare). BEFORE THE PROCEDURE   Ask your health care provider about changing or stopping your regular medicines.  You may be prescribed an oral bowel prep. This involves drinking a large amount of medicated liquid, starting the day before your procedure. The liquid will cause you to have multiple loose stools until your stool is almost clear or light green. This cleans out your colon in preparation for the procedure.  Do not eat or drink anything else once you have started the bowel prep, unless your health care provider tells you it is safe to do so.  Arrange for someone to drive you home after the procedure. PROCEDURE   You will be given medicine to help you relax (sedative).  You will lie on your side with your knees bent.  A long, flexible tube with a light and camera on the end (colonoscope) will be inserted through the rectum and into the colon. The camera sends video back to a computer screen as it moves through the colon. The colonoscope also releases carbon dioxide gas to inflate the colon. This helps your health care provider see the area better.  During the exam, your health care provider may take a small tissue  sample (biopsy) to be examined under a microscope if any abnormalities are found.  The exam is finished when the entire colon has been viewed. AFTER THE PROCEDURE   Do not drive for 24 hours after the exam.  You may have a small amount of blood in your stool.  You may pass moderate amounts of gas and have mild abdominal cramping or bloating. This is caused by the gas used to inflate your colon during the exam.  Ask when your test results will be ready and how you will get your results. Make sure you get your test results. Document Released: 02/21/2000 Document Revised: 12/14/2012 Document  Reviewed: 10/31/2012 [facility] Patient Information 2014 [place], [place]. Esophagogastroduodenoscopy Esophagogastroduodenoscopy (EGD) is a procedure to examine the lining of the esophagus, stomach, and first part of the small intestine (duodenum). A long, flexible, lighted tube with a camera attached (endoscope) is inserted down the throat to view these organs. This procedure is done to detect problems or abnormalities, such as inflammation, bleeding, ulcers, or growths, in order to treat them. The procedure lasts about 5 20 minutes. It is usually an outpatient procedure, but it may need to be performed in emergency cases in the hospital. LET YOUR CAREGIVER KNOW ABOUT:   Allergies to food or medicine.  All medicines you are taking, including vitamins, herbs, eyedrops, and over-the-counter medicines and creams.  Use of steroids (by mouth or creams).  Previous problems you or members of your family have had with the use of anesthetics.  Any blood disorders you have.  Previous surgeries you have had.  Other health problems you have.  Possibility of pregnancy, if this applies. RISKS AND COMPLICATIONS  Generally, EGD is a safe procedure. However, as with any procedure, complications can occur. Possible complications include:  Infection.  Bleeding.  Tearing (perforation) of the esophagus, stomach, or duodenum.  Difficulty breathing or not being able to breath.  Excessive sweating.  Spasms of the larynx.  Slowed heartbeat.  Low blood pressure. BEFORE THE PROCEDURE  Do not eat or drink anything for 6 8 hours before the procedure or as directed by your caregiver.  Ask your caregiver about changing or stopping your regular medicines.  If you wear dentures, be prepared to remove them before the procedure.  Arrange for someone to drive you home after the procedure. PROCEDURE   A vein will be accessed to give medicines and fluids. A medicine to relax you (sedative) and a pain  reliever will be given through that access into the vein.  A numbing medicine (local anesthetic) may be sprayed on your throat for comfort and to stop you from gagging or coughing.  A mouth guard may be placed in your mouth to protect your teeth and to keep you from biting on the endoscope.  You will be asked to lie on your left side.  The endoscope is inserted down your throat and into the esophagus, stomach, and duodenum.  Air is put through the endoscope to allow your caregiver to view the lining of your esophagus clearly.  The esophagus, stomach, and duodenum is then examined. During the exam, your caregiver may:  Remove tissue to be examined under a microscope (biopsy) for inflammation, infection, or other medical problems.  Remove growths.  Remove objects (foreign bodies) that are stuck.  Treat any bleeding with medicines or other devices that stop tissues from bleeding (hot cauters, clipping devices).  Widen (dilate) or stretch narrowed areas of the esophagus and stomach.  The endoscope will then  be withdrawn. AFTER THE PROCEDURE  You will be taken to a recovery area to be monitored. You will be able to go home once you are stable and alert.  Do not eat or drink anything until the local anesthetic and numbing medicines have worn off. You may choke.  It is normal to feel bloated, have pain with swallowing, or have a sore throat for a short time. This will wear off.  Your caregiver should be able to discuss his or her findings with you. It will take longer to discuss the test results if any biopsies were taken. Document Released: 06/26/2004 Document Revised: 02/10/2012 Document Reviewed: 01/27/2012 [facility] Patient Information 2014 [place]. PATIENT INSTRUCTIONS POST-ANESTHESIA  IMMEDIATELY FOLLOWING SURGERY:  Do not drive or operate machinery for the first twenty four hours after surgery.  Do not make any important decisions for twenty four hours after surgery or  while taking narcotic pain medications or sedatives.  If you develop intractable nausea and vomiting or a severe headache please notify your doctor immediately.  FOLLOW-UP:  Please make an appointment with your surgeon as instructed. You do not need to follow up with anesthesia unless specifically instructed to do so.  WOUND CARE INSTRUCTIONS (if applicable):  Keep a dry clean dressing on the anesthesia/puncture wound site if there is drainage.  Once the wound has quit draining you may leave it open to air.  Generally you should leave the bandage intact for twenty four hours unless there is drainage.  If the epidural site drains for more than 36-48 hours please call the anesthesia department.  QUESTIONS?:  Please feel free to call your physician or the hospital operator if you have any questions, and they will be happy to assist you.

## 2013-07-03 ENCOUNTER — Ambulatory Visit (HOSPITAL_COMMUNITY): Payer: Medicare Other | Admitting: Anesthesiology

## 2013-07-03 ENCOUNTER — Ambulatory Visit (HOSPITAL_COMMUNITY)
Admission: RE | Admit: 2013-07-03 | Discharge: 2013-07-03 | Disposition: A | Discharge status: Home / Self Care | Payer: Medicare Other | Source: Ambulatory Visit | Attending: Internal Medicine | Admitting: Internal Medicine

## 2013-07-03 ENCOUNTER — Encounter (HOSPITAL_COMMUNITY): Payer: Self-pay | Admitting: *Deleted

## 2013-07-03 ENCOUNTER — Encounter (HOSPITAL_COMMUNITY)
Admission: RE | Disposition: A | Discharge status: Home / Self Care | Payer: Self-pay | Source: Ambulatory Visit | Attending: Internal Medicine

## 2013-07-03 ENCOUNTER — Telehealth (INDEPENDENT_AMBULATORY_CARE_PROVIDER_SITE_OTHER): Payer: Self-pay | Admitting: *Deleted

## 2013-07-03 ENCOUNTER — Other Ambulatory Visit (INDEPENDENT_AMBULATORY_CARE_PROVIDER_SITE_OTHER): Payer: Self-pay | Admitting: Internal Medicine

## 2013-07-03 ENCOUNTER — Encounter (HOSPITAL_COMMUNITY): Payer: Medicare Other | Admitting: Anesthesiology

## 2013-07-03 DIAGNOSIS — K3189 Other diseases of stomach and duodenum: Secondary | ICD-10-CM

## 2013-07-03 DIAGNOSIS — K766 Portal hypertension: Principal | ICD-10-CM

## 2013-07-03 DIAGNOSIS — K319 Disease of stomach and duodenum, unspecified: Secondary | ICD-10-CM | POA: Insufficient documentation

## 2013-07-03 DIAGNOSIS — Z79899 Other long term (current) drug therapy: Secondary | ICD-10-CM | POA: Insufficient documentation

## 2013-07-03 DIAGNOSIS — D509 Iron deficiency anemia, unspecified: Secondary | ICD-10-CM | POA: Insufficient documentation

## 2013-07-03 DIAGNOSIS — D649 Anemia, unspecified: Secondary | ICD-10-CM

## 2013-07-03 DIAGNOSIS — K2100 Gastro-esophageal reflux disease with esophagitis, without bleeding: Secondary | ICD-10-CM | POA: Insufficient documentation

## 2013-07-03 DIAGNOSIS — R109 Unspecified abdominal pain: Secondary | ICD-10-CM | POA: Insufficient documentation

## 2013-07-03 DIAGNOSIS — K208 Other esophagitis without bleeding: Secondary | ICD-10-CM

## 2013-07-03 DIAGNOSIS — I10 Essential (primary) hypertension: Secondary | ICD-10-CM | POA: Insufficient documentation

## 2013-07-03 DIAGNOSIS — K449 Diaphragmatic hernia without obstruction or gangrene: Secondary | ICD-10-CM | POA: Insufficient documentation

## 2013-07-03 DIAGNOSIS — R195 Other fecal abnormalities: Secondary | ICD-10-CM

## 2013-07-03 DIAGNOSIS — K625 Hemorrhage of anus and rectum: Secondary | ICD-10-CM

## 2013-07-03 DIAGNOSIS — Z87891 Personal history of nicotine dependence: Secondary | ICD-10-CM | POA: Insufficient documentation

## 2013-07-03 HISTORY — PX: ESOPHAGOGASTRODUODENOSCOPY (EGD) WITH PROPOFOL: SHX5813

## 2013-07-03 HISTORY — PX: COLONOSCOPY WITH PROPOFOL: SHX5780

## 2013-07-03 SURGERY — ESOPHAGOGASTRODUODENOSCOPY (EGD) WITH PROPOFOL
Anesthesia: Monitor Anesthesia Care

## 2013-07-03 MED ORDER — MIDAZOLAM HCL 2 MG/2ML IJ SOLN
1.0000 mg | INTRAMUSCULAR | Status: DC | PRN
Start: 2013-07-03 — End: 2013-07-03
  Administered 2013-07-03: 2 mg via INTRAVENOUS

## 2013-07-03 MED ORDER — PANTOPRAZOLE SODIUM 40 MG PO TBEC: 40.0000 mg | DELAYED_RELEASE_TABLET | Freq: Two times a day (BID) | ORAL | Status: DC

## 2013-07-03 MED ORDER — SODIUM CHLORIDE BACTERIOSTATIC 0.9 % IJ SOLN
INTRAMUSCULAR | Status: AC
Start: 2013-07-03 — End: 2013-07-03
  Filled 2013-07-03: qty 10

## 2013-07-03 MED ORDER — EPHEDRINE SULFATE 50 MG/ML IJ SOLN
INTRAMUSCULAR | Status: DC | PRN
  Administered 2013-07-03: 5 mg via INTRAVENOUS
  Administered 2013-07-03: 10 mg via INTRAVENOUS

## 2013-07-03 MED ORDER — FENTANYL CITRATE 0.05 MG/ML IJ SOLN
INTRAMUSCULAR | Status: AC
  Filled 2013-07-03: qty 2

## 2013-07-03 MED ORDER — MIDAZOLAM HCL 2 MG/2ML IJ SOLN
INTRAMUSCULAR | Status: AC
  Filled 2013-07-03: qty 2

## 2013-07-03 MED ORDER — EPHEDRINE SULFATE 50 MG/ML IJ SOLN
INTRAMUSCULAR | Status: AC
  Filled 2013-07-03: qty 1

## 2013-07-03 MED ORDER — FENTANYL CITRATE 0.05 MG/ML IJ SOLN
25.0000 ug | INTRAMUSCULAR | Status: AC
  Administered 2013-07-03 (×2): 25 ug via INTRAVENOUS
  Filled 2013-07-03: qty 2

## 2013-07-03 MED ORDER — GLYCOPYRROLATE 0.2 MG/ML IJ SOLN
0.2000 mg | Freq: Once | INTRAMUSCULAR | Status: AC
  Administered 2013-07-03: 0.2 mg via INTRAVENOUS
  Filled 2013-07-03: qty 1

## 2013-07-03 MED ORDER — STERILE WATER FOR IRRIGATION IR SOLN
Status: DC | PRN
  Administered 2013-07-03: 07:00:00

## 2013-07-03 MED ORDER — LACTATED RINGERS IV SOLN
INTRAVENOUS | Status: DC
  Administered 2013-07-03: 07:00:00 via INTRAVENOUS

## 2013-07-03 MED ORDER — PROPOFOL INFUSION 10 MG/ML OPTIME
INTRAVENOUS | Status: DC | PRN
  Administered 2013-07-03: 150 ug/kg/min via INTRAVENOUS

## 2013-07-03 MED ORDER — FERROUS SULFATE 325 (65 FE) MG PO TABS: 325.0000 mg | ORAL_TABLET | Freq: Every day | ORAL | Status: DC

## 2013-07-03 MED ORDER — ONDANSETRON HCL 4 MG/2ML IJ SOLN
4.0000 mg | Freq: Once | INTRAMUSCULAR | Status: AC
  Administered 2013-07-03: 4 mg via INTRAVENOUS
  Filled 2013-07-03: qty 2

## 2013-07-03 MED ORDER — PROPOFOL 10 MG/ML IV EMUL
INTRAVENOUS | Status: AC
  Filled 2013-07-03: qty 20

## 2013-07-03 MED ORDER — MIDAZOLAM HCL 5 MG/5ML IJ SOLN
INTRAMUSCULAR | Status: DC | PRN
  Administered 2013-07-03 (×2): 1 mg via INTRAVENOUS

## 2013-07-03 MED ORDER — BUTAMBEN-TETRACAINE-BENZOCAINE 2-2-14 % EX AERO
1.0000 | INHALATION_SPRAY | Freq: Once | CUTANEOUS | Status: AC
  Administered 2013-07-03: 1 via TOPICAL
  Filled 2013-07-03: qty 56

## 2013-07-03 SURGICAL SUPPLY — 28 items
BLOCK BITE 60FR ADLT L/F BLUE (MISCELLANEOUS) ×2 IMPLANT
ELECT REM PT RETURN 9FT ADLT (ELECTROSURGICAL)
ELECTRODE REM PT RTRN 9FT ADLT (ELECTROSURGICAL) IMPLANT
FCP BXJMBJMB 240X2.8X (CUTTING FORCEPS)
FLOOR PAD 36X40 (MISCELLANEOUS) ×3
FORCEP COLD BIOPSY (CUTTING FORCEPS) IMPLANT
FORCEPS BIOP RAD 4 LRG CAP 4 (CUTTING FORCEPS) IMPLANT
FORCEPS BIOP RJ4 240 W/NDL (CUTTING FORCEPS)
FORCEPS BXJMBJMB 240X2.8X (CUTTING FORCEPS) IMPLANT
FORMALIN 10 PREFIL 20ML (MISCELLANEOUS) IMPLANT
INJECTOR/SNARE I SNARE (MISCELLANEOUS) IMPLANT
KIT CLEAN ENDO COMPLIANCE (KITS) ×3 IMPLANT
LUBRICANT JELLY 4.5OZ STERILE (MISCELLANEOUS) ×2 IMPLANT
MANIFOLD NEPTUNE II (INSTRUMENTS) ×3 IMPLANT
NDL SCLEROTHERAPY 25GX240 (NEEDLE) IMPLANT
NEEDLE SCLEROTHERAPY 25GX240 (NEEDLE) IMPLANT
PAD FLOOR 36X40 (MISCELLANEOUS) IMPLANT
PROBE APC STR FIRE (PROBE) IMPLANT
PROBE INJECTION GOLD (MISCELLANEOUS)
PROBE INJECTION GOLD 7FR (MISCELLANEOUS) IMPLANT
SNARE ROTATE MED OVAL 20MM (MISCELLANEOUS) IMPLANT
SNARE SHORT THROW 13M SML OVAL (MISCELLANEOUS) ×1 IMPLANT
SYR 50ML LL SCALE MARK (SYRINGE) ×2 IMPLANT
SYR INFLATION 60ML (SYRINGE) ×2 IMPLANT
TRAP SPECIMEN MUCOUS 40CC (MISCELLANEOUS) IMPLANT
TUBING ENDO SMARTCAP PENTAX (MISCELLANEOUS) IMPLANT
TUBING IRRIGATION ENDOGATOR (MISCELLANEOUS) ×2 IMPLANT
WATER STERILE IRR 1000ML POUR (IV SOLUTION) ×2 IMPLANT

## 2013-07-03 NOTE — Discharge Instructions (Signed)
Discontinue Nexium but resume usual medications including xarelto. Pantoprazole 40 mg by mouth 30 minutes before breakfast and evening meal daily. Ferrous sulfate 325 mg by mouth daily with meal. Abdominal ultrasound to be scheduled. No driving for 24 hours. CBC in 1 month. Office visit in 4 months.    Colonoscopy, Care After Refer to this sheet in the next few weeks. These instructions provide you with information on caring for yourself after your procedure. Your health care provider may also give you more specific instructions. Your treatment has been planned according to current medical practices, but problems sometimes occur. Call your health care provider if you have any problems or questions after your procedure. WHAT TO EXPECT AFTER THE PROCEDURE  After your procedure, it is typical to have the following:  A small amount of blood in your stool.  Moderate amounts of gas and mild abdominal cramping or bloating. HOME CARE INSTRUCTIONS  Do not drive, operate machinery, or sign important documents for 24 hours.  You may shower and resume your regular physical activities, but move at a slower pace for the first 24 hours.  Take frequent rest periods for the first 24 hours.  Walk around or put a warm pack on your abdomen to help reduce abdominal cramping and bloating.  Drink enough fluids to keep your urine clear or pale yellow.  You may resume your normal diet as instructed by your health care provider. Avoid heavy or fried foods that are hard to digest.  Avoid drinking alcohol for 24 hours or as instructed by your health care provider.  Only take over-the-counter or prescription medicines as directed by your health care provider.  If a tissue sample (biopsy) was taken during your procedure:  Do not take aspirin or blood thinners for 7 days, or as instructed by your health care provider.  Do not drink alcohol for 7 days, or as instructed by your health care provider.  Eat  soft foods for the first 24 hours. SEEK MEDICAL CARE IF: You have persistent spotting of blood in your stool 2 3 days after the procedure. SEEK IMMEDIATE MEDICAL CARE IF:  You have more than a small spotting of blood in your stool.  You pass large blood clots in your stool.  Your abdomen is swollen (distended).  You have nausea or vomiting.  You have a fever.  You have increasing abdominal pain that is not relieved with medicine. Document Released: 10/08/2003 Document Revised: 12/14/2012 Document Reviewed: 10/31/2012 [facility] Patient Information 2014 [place], [place]. Esophagogastroduodenoscopy Care After Refer to this sheet in the next few weeks. These instructions provide you with information on caring for yourself after your procedure. Your caregiver may also give you more specific instructions. Your treatment has been planned according to current medical practices, but problems sometimes occur. Call your caregiver if you have any problems or questions after your procedure.  HOME CARE INSTRUCTIONS  Do not eat or drink anything until the numbing medicine (local anesthetic) has worn off and your gag reflex has returned. You will know that the local anesthetic has worn off when you can swallow comfortably.  Do not drive for 12 hours after the procedure or as directed by your caregiver.  Only take medicines as directed by your caregiver. SEEK MEDICAL CARE IF:   You cannot stop coughing.  You are not urinating at all or less than usual. SEEK IMMEDIATE MEDICAL CARE IF:  You have difficulty swallowing.  You cannot eat or drink.  You have worsening throat or  chest pain.  You have dizziness, lightheadedness, or you faint.  You have nausea or vomiting.  You have chills.  You have a fever.  You have severe abdominal pain.  You have black, tarry, or bloody stools. Document Released: 02/10/2012 Document Reviewed: 02/10/2012 [facility] Patient Information 2014 [place].

## 2013-07-03 NOTE — Transfer of Care (Signed)
Immediate Anesthesia Transfer of Care Note  Patient: William GongHenry O Burch  Procedure(s) Performed: Procedure(s) with comments: ESOPHAGOGASTRODUODENOSCOPY (EGD) WITH PROPOFOL (N/A) COLONOSCOPY WITH PROPOFOL (N/A) - in cecum at 0759; withdrawal time 11 minutes  Patient Location: PACU  Anesthesia Type:MAC  Level of Consciousness: awake, alert  and oriented  Airway & Oxygen Therapy: Patient Spontanous Breathing  Post-op Assessment: Report given to PACU RN  Post vital signs: Reviewed and stable  Complications: No apparent anesthesia complications

## 2013-07-03 NOTE — Op Note (Signed)
EGD AND COLONOSCOPY PROCEDURE REPORT  PATIENT:  William Burch  MR#:  [id number] Birthdate:  1960-03-30, 53 y.o., male Endoscopist:  Dr. Malissa HippoNajeeb U. Bob Daversa, MD Referred By:  Dr. Josue HectorLeonard Robert Nyland, MD  Procedure Date: 07/03/2013  Procedure:   EGD & Colonoscopy  Indications:  Patient is 53 year old Caucasian male who was found to have iron deficiency anemia and heme positive stool during recent hospitalization for pneumonia and pulmonary embolism. Hemoglobin is improved by 2 g by po iron. He has chronic GERD and maintain her Nexium which he believes is giving him diarrhea and abdominal pain. He is undergoing diagnostic EGD and colonoscopy to            Informed Consent:  The risks, benefits, alternatives & imponderables which include, but are not limited to, bleeding, infection, perforation, drug reaction and potential missed lesion have been reviewed.  The potential for biopsy, lesion removal, esophageal dilation, etc. have also been discussed.  Questions have been answered.  All parties agreeable.  Please see history & physical in medical record for more information.  Medications:  Cetacaine spray topically for oropharyngeal anesthesia Monitored anesthesia care. Please refer to anesthesia records for details.  EGD  Description of procedure:  The endoscope was introduced through the mouth and advanced to the second portion of the duodenum without difficulty or limitations. The mucosal surfaces were surveyed very carefully during advancement of the scope and upon withdrawal.  Findings:  Esophagus:  Mucosa of the proximal and middle third was normal. Scarring noted to mucosa of distal 2 cm with two erosions and linear ulcer about 2 cm long extending to GE junction. No ring or stricture noted. GEJ:  38 cm Hiatus:  40 cm Stomach:  Stomach was empty and distended very well with insufflation. Folds in the proximal stomach are normal. Examination mucosa gastric body revealed mosaic pattern. Antral  mucosa was normal. Pyloric channel was patent. Angularis fundus and cardia were examined by retroflexing in the scope and were normal.  Duodenum:  Normal bulbar and post bulbar mucosa.  Therapeutic/Diagnostic Maneuvers Performed:  None  COLONOSCOPY Description of procedure:  After a digital rectal exam was performed, that colonoscope was advanced from the anus through the rectum and colon to the area of the cecum, ileocecal valve and appendiceal orifice. The cecum was deeply intubated. These structures were well-seen and photographed for the record. From the level of the cecum and ileocecal valve, the scope was slowly and cautiously withdrawn. The mucosal surfaces were carefully surveyed utilizing scope tip to flexion to facilitate fold flattening as needed. The scope was pulled down into the rectum where a thorough exam including retroflexion was performed.  Findings:   Prep fair to satisfactory. Normal mucosa of area segments of the colon and rectum. Unremarkable anorectal junction.  Therapeutic/Diagnostic Maneuvers Performed:  None  Complications:  None  Cecal Withdrawal Time:  11  minutes  Impression:   EGD findings; Erosive/ulcerative reflux esophagitis with scarring at distal esophagus. Small sliding hiatal hernia. Mild changes of portal gastropathy.  Colonoscopy findings; Normal colonoscopy.  Comments; Significance of portal gastrapthy unclear but will do ultrasound to rule out chronic liver disease. He possibly lost blood from upper GI tract.  Recommendations:  Discontinue Nexium. Pantoprazole 40 mg by mouth twice a day. Resume ferrous sulfate at 325 mg by mouth daily. Upper abdominal ultrasound. CBC in 1 month. Office visit in 4 months.  Malissa Hippoajeeb U Stephanos Fan  07/03/2013 8:17 AM  CC: Dr. Josue HectorNYLAND,LEONARD ROBERT, MD & Dr. Bonnetta BarryNo ref.  provider found

## 2013-07-03 NOTE — Telephone Encounter (Signed)
Per Dr.Rehman the patient will need to have labs drawn in 1 month. 

## 2013-07-03 NOTE — Telephone Encounter (Signed)
Per op note patient needs CBC in 1 month

## 2013-07-03 NOTE — H&P (Signed)
William Burch is an 53 y.o. male.   Chief Complaint: Patient's here for EGD and colonoscopy. HPI: Patient is 53 year old Caucasian male who was hospitalized about 11 weeks ago with pneumonia and noted to have small pulmonary embolism. He was also found to be in anemic with hemoglobin of around 9.2. Serum iron was 13 and TIBC of 222 with saturation of 6%. Serum ferritin was normal at 336 and felt to be acute phase reactant. B12 and folate levels were normal. He was noted to have heme-positive stools. Evaluation was delayed because of acute problems. Patient has chronic GERD and heartburn is well controlled with Nexium which she believes is giving him diarrhea and abdominal pain. He denies dysphagia nausea vomiting melena or rectal bleeding. His hemoglobin has come up to 11.4 with by mouth iron. He has very good appetite. He states he has gained 13 pounds in the last 6 months. He does not take OTC NSAIDs. Last dose of xarelto was 2 days ago. Family history significant for colon carcinoma in a maternal aunt who was in her early 10s at the time of diagnosis and died after several months of metastatic disease. He complains of chronic back and leg pain and is on narcotic therapy.  Past Medical History  Diagnosis Date  . Hypertension   . Acid reflux   . PE (pulmonary embolism)     in Febuary of this year  . Chronic back pain   . 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 Back surgery      l fusion with rods    Family History  Problem Relation Age of Onset  . Pancreatic cancer Mother   . Pancreatic cancer Father    Social History:  reports that he quit smoking about 5 weeks ago. His smoking use included Cigarettes. He has a 2 pack-year smoking history. He does not have any smokeless tobacco history on file. He reports that he does not drink alcohol or use illicit drugs.  Allergies:   Allergies  Allergen Reactions  . Morphine And Related Other (See Comments)    REACTION: patient states that he was administered this medication during surgery. States that he "about died from it." patient is not sure whether the medication caused the reaction or the amount given.  Ebbie Ridge [Pseudoephedrine Hcl] Rash    Medications Prior to Admission  Medication Sig Dispense Refill  . esomeprazole (NEXIUM) 20 MG capsule Take 20 mg by mouth daily at 12 noon.      . fentaNYL (DURAGESIC - DOSED MCG/HR) 25 MCG/HR patch Place 1 patch onto the skin every 3 (three) days.      Marland Kitchen losartan-hydrochlorothiazide (HYZAAR) 100-25 MG per tablet Take 1 tablet by mouth daily.      . metoprolol tartrate (LOPRESSOR) 25 MG tablet Take 1 tablet by mouth daily as needed. bp      . oxyCODONE-acetaminophen (PERCOCET) 10-325 MG per tablet Take 1 tablet by mouth every 4 (four) hours as needed for pain.      . peg 3350 powder (MOVIPREP) 100 G SOLR Take 1 kit (200 g total) by mouth once.  1 kit  0  . Rivaroxaban (XARELTO) 20 MG TABS tablet Take 1 tablet (20 mg total) by mouth daily. To be started once 19m bid dosing is complete  30 tablet  4  . zolpidem (  AMBIEN) 10 MG tablet Take 10 mg by mouth at bedtime.        No results found for this or any previous visit (from the past 48 hour(s)). No results found.  ROS  Blood pressure 118/81, pulse 61, temperature 97.8 F (36.6 C), temperature source Oral, resp. rate 20, SpO2 99.00%. Physical Exam  Constitutional: He appears well-developed and well-nourished.  HENT:  Mouth/Throat: Oropharynx is clear and moist.  Eyes: Conjunctivae are normal. No scleral icterus.  Neck: No thyromegaly present.  Cardiovascular: Normal rate, regular rhythm and normal heart sounds.   No murmur heard. Respiratory: Effort normal and breath sounds normal.  GI: Soft. He exhibits no distension and no mass. Tenderness: mild tenderness to left of umbilicus without guarding. There is no  rebound.  Musculoskeletal: He exhibits no edema.  Lymphadenopathy:    He has no cervical adenopathy.  Neurological: He is alert.  Skin: Skin is warm and dry.     Assessment/Plan Iron deficiency anemia and heme positive stool. Chronic GERD. Diagnostic EGD and colonoscopy under monitored anesthesia care.  [employer] 07/03/2013, 7:16 AM

## 2013-07-03 NOTE — Telephone Encounter (Signed)
Lab noted for May 27 th 2015.

## 2013-07-03 NOTE — Anesthesia Preprocedure Evaluation (Signed)
Anesthesia Evaluation  Patient identified by MRN, date of birth, ID band Patient awake    Reviewed: Allergy & Precautions, H&P , NPO status , Patient's Chart, lab work & pertinent test results, reviewed documented beta blocker date and time   Airway Mallampati: III TM Distance: >3 FB Neck ROM: Full    Dental  (+) Edentulous Upper, Edentulous Lower   Pulmonary pneumonia -, resolved, former smoker,  breath sounds clear to auscultation        Cardiovascular hypertension, Pt. on home beta blockers and Pt. on medications DVT Rhythm:Regular Rate:Normal     Neuro/Psych Seizures -,     GI/Hepatic GERD-  Medicated,  Endo/Other    Renal/GU Renal InsufficiencyRenal disease     Musculoskeletal   Abdominal   Peds  Hematology  (+) anemia ,   Anesthesia Other Findings   Reproductive/Obstetrics                           Anesthesia Physical Anesthesia Plan  ASA: III  Anesthesia Plan: MAC   Post-op Pain Management:    Induction: Intravenous  Airway Management Planned: Simple Face Mask  Additional Equipment:   Intra-op Plan:   Post-operative Plan:   Informed Consent: I have reviewed the patients History and Physical, chart, labs and discussed the procedure including the risks, benefits and alternatives for the proposed anesthesia with the patient or authorized representative who has indicated his/her understanding and acceptance.     Plan Discussed with:   Anesthesia Plan Comments:         Anesthesia Quick Evaluation

## 2013-07-03 NOTE — Anesthesia Postprocedure Evaluation (Signed)
  Anesthesia Post-op Note  Patient: William Burch  Procedure(s) Performed: Procedure(s) with comments: ESOPHAGOGASTRODUODENOSCOPY (EGD) WITH PROPOFOL (N/A) COLONOSCOPY WITH PROPOFOL (N/A) - in cecum at 0759; withdrawal time 11 minutes  Patient Location: PACU  Anesthesia Type:MAC  Level of Consciousness: awake, alert  and oriented  Airway and Oxygen Therapy: Patient Spontanous Breathing  Post-op Pain: none  Post-op Assessment: Post-op Vital signs reviewed, Patient's Cardiovascular Status Stable, Respiratory Function Stable, Patent Airway and No signs of Nausea or vomiting  Post-op Vital Signs: Reviewed and stable  Last Vitals:  Filed Vitals:   07/03/13 0725  BP: 95/64  Pulse:   Temp:   Resp: 20    Complications: No apparent anesthesia complications

## 2013-07-05 ENCOUNTER — Other Ambulatory Visit (INDEPENDENT_AMBULATORY_CARE_PROVIDER_SITE_OTHER): Payer: Self-pay | Admitting: *Deleted

## 2013-07-05 ENCOUNTER — Encounter (INDEPENDENT_AMBULATORY_CARE_PROVIDER_SITE_OTHER): Payer: Self-pay | Admitting: *Deleted

## 2013-07-05 ENCOUNTER — Encounter (HOSPITAL_COMMUNITY): Payer: Self-pay | Admitting: Internal Medicine

## 2013-07-05 DIAGNOSIS — K625 Hemorrhage of anus and rectum: Secondary | ICD-10-CM

## 2013-07-07 ENCOUNTER — Other Ambulatory Visit (HOSPITAL_COMMUNITY): Payer: Medicare Other

## 2013-07-10 ENCOUNTER — Ambulatory Visit (HOSPITAL_COMMUNITY)
Admission: RE | Admit: 2013-07-10 | Discharge: 2013-07-10 | Disposition: A | Discharge status: Home / Self Care | Payer: Medicare Other | Source: Ambulatory Visit | Attending: Internal Medicine | Admitting: Internal Medicine

## 2013-07-10 DIAGNOSIS — K319 Disease of stomach and duodenum, unspecified: Secondary | ICD-10-CM | POA: Insufficient documentation

## 2013-07-10 DIAGNOSIS — K3189 Other diseases of stomach and duodenum: Secondary | ICD-10-CM

## 2013-07-10 DIAGNOSIS — K766 Portal hypertension: Secondary | ICD-10-CM | POA: Insufficient documentation

## 2013-08-02 LAB — CBC
HCT: 38.6 % — ABNORMAL LOW (ref 39.0–52.0)
HEMOGLOBIN: 12.8 g/dL — AB (ref 13.0–17.0)
MCH: 28.8 pg (ref 26.0–34.0)
MCHC: 33.2 g/dL (ref 30.0–36.0)
MCV: 86.7 fL (ref 78.0–100.0)
Platelets: 359 10*3/uL (ref 150–400)
RBC: 4.45 MIL/uL (ref 4.22–5.81)
RDW: 15.9 % — AB (ref 11.5–15.5)
WBC: 10.9 10*3/uL — ABNORMAL HIGH (ref 4.0–10.5)

## 2013-10-19 ENCOUNTER — Other Ambulatory Visit (INDEPENDENT_AMBULATORY_CARE_PROVIDER_SITE_OTHER): Payer: Self-pay | Admitting: Internal Medicine

## 2013-10-19 NOTE — Telephone Encounter (Signed)
Per Dr.Rehman may fill , with 1 refill as the patient has an appointment 11/21/13. Further refills will be done at this time.

## 2013-11-21 ENCOUNTER — Ambulatory Visit (INDEPENDENT_AMBULATORY_CARE_PROVIDER_SITE_OTHER): Payer: Medicare Other | Admitting: Internal Medicine

## 2013-11-21 ENCOUNTER — Encounter (INDEPENDENT_AMBULATORY_CARE_PROVIDER_SITE_OTHER): Payer: Self-pay | Admitting: Internal Medicine

## 2013-11-21 VITALS — BP 114/68 | HR 66 | Temp 98.5°F | Resp 18 | Ht 69.0 in | Wt 208.2 lb

## 2013-11-21 DIAGNOSIS — K7689 Other specified diseases of liver: Secondary | ICD-10-CM

## 2013-11-21 DIAGNOSIS — K2100 Gastro-esophageal reflux disease with esophagitis, without bleeding: Secondary | ICD-10-CM

## 2013-11-21 DIAGNOSIS — D509 Iron deficiency anemia, unspecified: Secondary | ICD-10-CM

## 2013-11-21 DIAGNOSIS — K760 Fatty (change of) liver, not elsewhere classified: Secondary | ICD-10-CM

## 2013-11-21 LAB — HEPATIC FUNCTION PANEL
ALK PHOS: 85 U/L (ref 39–117)
ALT: 18 U/L (ref 0–53)
AST: 18 U/L (ref 0–37)
Albumin: 4.1 g/dL (ref 3.5–5.2)
Bilirubin, Direct: 0.1 mg/dL (ref 0.0–0.3)
Indirect Bilirubin: 0.2 mg/dL (ref 0.2–1.2)
Total Bilirubin: 0.3 mg/dL (ref 0.2–1.2)
Total Protein: 7.8 g/dL (ref 6.0–8.3)

## 2013-11-21 LAB — IRON AND TIBC
%SAT: 19 % — ABNORMAL LOW (ref 20–55)
IRON: 57 ug/dL (ref 42–165)
TIBC: 308 ug/dL (ref 215–435)
UIBC: 251 ug/dL (ref 125–400)

## 2013-11-21 LAB — FERRITIN: Ferritin: 140 ng/mL (ref 22–322)

## 2013-11-21 LAB — CBC
HEMATOCRIT: 35.7 % — AB (ref 39.0–52.0)
HEMOGLOBIN: 12 g/dL — AB (ref 13.0–17.0)
MCH: 28.5 pg (ref 26.0–34.0)
MCHC: 33.6 g/dL (ref 30.0–36.0)
MCV: 84.8 fL (ref 78.0–100.0)
Platelets: 364 10*3/uL (ref 150–400)
RBC: 4.21 MIL/uL — ABNORMAL LOW (ref 4.22–5.81)
RDW: 15.8 % — ABNORMAL HIGH (ref 11.5–15.5)
WBC: 9.5 10*3/uL (ref 4.0–10.5)

## 2013-11-21 NOTE — Patient Instructions (Signed)
Physician will call with results of blood tests 

## 2013-11-21 NOTE — Progress Notes (Signed)
Presenting complaint;  Followup for GERD and iron deficiency anemia.  Database;  Patient is 53 year old Caucasian male who was initially seen in April of this year for iron deficiency anemia and heme positive stool. He underwent EGD and colonoscopy on 07/03/2013. EGD revealed erosive/ulcerative reflux esophagitis with scarring and distal esophagus small sliding hiatal hernia and mild changes of portal gastropathy. Colonoscopy was normal. Ultrasound was obtained because of portal gastropathy and no changes of cirrhosis noted but he did have fatty liver. Patient was switched from Nexium to pantoprazole and begun on ferrous sulfate. H&H on 08/02/2013 was 12.8 and 38.6.   Subjective:  Patient has no complaints. He feels much better. He denies heartburn chest pain shortness of breath dysphagia or abdominal pain. His bowels move daily. His stool color is dark her blocks since she has been on iron. He denies rectal bleeding. He is watching his appetite and rarely eats meat. He also does not add salt to his food. He is not having any side effects to pantoprazole. He says he weighed 339 pounds when he was 53 years old and he has managed to gradually get down to current weight.   Current Medications: Outpatient Encounter Prescriptions as of 11/21/2013  Medication Sig  . ferrous sulfate (FERROUSUL) 325 (65 FE) MG tablet Take 1 tablet (325 mg total) by mouth daily with breakfast.  . losartan-hydrochlorothiazide (HYZAAR) 100-25 MG per tablet Take 1 tablet by mouth daily.  . metoprolol tartrate (LOPRESSOR) 25 MG tablet Take 1 tablet by mouth daily as needed. bp  . oxyCODONE-acetaminophen (PERCOCET) 10-325 MG per tablet Take 1 tablet by mouth every 4 (four) hours as needed for pain.  . pantoprazole (PROTONIX) 40 MG tablet TAKE ONE TABLET BY MOUTH TWICE DAILY BEFORE MEALS  . Rivaroxaban (XARELTO) 20 MG TABS tablet Take 1 tablet (20 mg total) by mouth daily. To be started once $RemoveBefo'15mg'sMyMGmchzYK$  bid dosing is complete   . zolpidem (AMBIEN) 10 MG tablet Take 10 mg by mouth at bedtime.  . [DISCONTINUED] fentaNYL (DURAGESIC - DOSED MCG/HR) 25 MCG/HR patch Place 1 patch onto the skin every 3 (three) days.  . [DISCONTINUED] peg 3350 powder (MOVIPREP) 100 G SOLR Take 1 kit (200 g total) by mouth once.    Objective: Blood pressure 114/68, pulse 66, temperature 98.5 F (36.9 C), temperature source Oral, resp. rate 18, height $RemoveBe'5\' 9"'aMJvLkXGO$  (1.753 m), weight 208 lb 3.2 oz (94.439 kg). Patient is alert and in no acute distress. Conjunctiva is pink. Sclera is nonicteric Oropharyngeal mucosa is normal. No neck masses or thyromegaly noted. Cardiac exam with regular rhythm normal S1 and S2. No murmur or gallop noted. Lungs are clear to auscultation. Abdomen his symmetrical soft and nontender without organomegaly or masses. No LE edema or clubbing noted.  Labs/studies Results: H&H was 9.2 and 27.7 on 04/20/2013. H&H was 11.4 and 34.3 on 06/26/2013 H&H was 12.8 and 38.6 on 08/02/2013.   Assessment:  #1. Iron deficiency anemia. Suspect he may have been losing blood from his esophagus in the setting of chronic anticoagulations for history of pulmonary embolism. No indication for further evaluation with small bowel given capsule study unless he has evidence of overt GI bleed. #2. Erosive/ulcerative reflux esophagitis. Symptoms are well controlled with therapy. Will consider dropping PPI dose on his next visit. #3. Fatty liver.   Plan:  Patient will go to lab for CBC serum iron TIBC ferritin and hepatic panel. Patient advised to call if he has rectal bleeding. Office visit in 6 months.

## 2013-11-27 ENCOUNTER — Telehealth (INDEPENDENT_AMBULATORY_CARE_PROVIDER_SITE_OTHER): Payer: Self-pay | Admitting: *Deleted

## 2013-11-27 DIAGNOSIS — D509 Iron deficiency anemia, unspecified: Secondary | ICD-10-CM

## 2013-11-27 NOTE — Telephone Encounter (Signed)
Per Dr.Rehman the patient will need to have labs drawn 8 weeks. 

## 2013-12-20 ENCOUNTER — Other Ambulatory Visit (INDEPENDENT_AMBULATORY_CARE_PROVIDER_SITE_OTHER): Payer: Self-pay | Admitting: Internal Medicine

## 2013-12-27 ENCOUNTER — Encounter (INDEPENDENT_AMBULATORY_CARE_PROVIDER_SITE_OTHER): Payer: Self-pay | Admitting: *Deleted

## 2013-12-27 ENCOUNTER — Other Ambulatory Visit (INDEPENDENT_AMBULATORY_CARE_PROVIDER_SITE_OTHER): Payer: Self-pay | Admitting: *Deleted

## 2013-12-27 DIAGNOSIS — D509 Iron deficiency anemia, unspecified: Secondary | ICD-10-CM

## 2014-01-22 LAB — HEMOGLOBIN AND HEMATOCRIT, BLOOD
HCT: 37.5 % — ABNORMAL LOW (ref 39.0–52.0)
Hemoglobin: 12.4 g/dL — ABNORMAL LOW (ref 13.0–17.0)

## 2014-01-23 ENCOUNTER — Telehealth (INDEPENDENT_AMBULATORY_CARE_PROVIDER_SITE_OTHER): Payer: Self-pay | Admitting: *Deleted

## 2014-01-23 DIAGNOSIS — D509 Iron deficiency anemia, unspecified: Secondary | ICD-10-CM

## 2014-01-23 NOTE — Telephone Encounter (Signed)
Per Dr.Rehman the patient will need to have labs drawn prior to OV 05/21/14.

## 2014-02-10 ENCOUNTER — Emergency Department (HOSPITAL_COMMUNITY): Payer: Medicare Other

## 2014-02-10 ENCOUNTER — Encounter (HOSPITAL_COMMUNITY): Payer: Self-pay

## 2014-02-10 ENCOUNTER — Emergency Department (HOSPITAL_COMMUNITY)
Admission: EM | Admit: 2014-02-10 | Discharge: 2014-02-10 | Disposition: A | Discharge status: Home / Self Care | Payer: Medicare Other | Attending: Emergency Medicine | Admitting: Emergency Medicine

## 2014-02-10 DIAGNOSIS — J189 Pneumonia, unspecified organism: Secondary | ICD-10-CM

## 2014-02-10 DIAGNOSIS — Z87891 Personal history of nicotine dependence: Secondary | ICD-10-CM | POA: Diagnosis not present

## 2014-02-10 DIAGNOSIS — K219 Gastro-esophageal reflux disease without esophagitis: Secondary | ICD-10-CM | POA: Diagnosis not present

## 2014-02-10 DIAGNOSIS — J159 Unspecified bacterial pneumonia: Secondary | ICD-10-CM | POA: Insufficient documentation

## 2014-02-10 DIAGNOSIS — Z792 Long term (current) use of antibiotics: Secondary | ICD-10-CM | POA: Diagnosis not present

## 2014-02-10 DIAGNOSIS — I10 Essential (primary) hypertension: Secondary | ICD-10-CM | POA: Insufficient documentation

## 2014-02-10 DIAGNOSIS — R1011 Right upper quadrant pain: Secondary | ICD-10-CM | POA: Insufficient documentation

## 2014-02-10 DIAGNOSIS — Z79899 Other long term (current) drug therapy: Secondary | ICD-10-CM | POA: Insufficient documentation

## 2014-02-10 DIAGNOSIS — Z7901 Long term (current) use of anticoagulants: Secondary | ICD-10-CM | POA: Insufficient documentation

## 2014-02-10 DIAGNOSIS — Z86711 Personal history of pulmonary embolism: Secondary | ICD-10-CM | POA: Diagnosis not present

## 2014-02-10 DIAGNOSIS — R05 Cough: Secondary | ICD-10-CM | POA: Diagnosis present

## 2014-02-10 DIAGNOSIS — G8929 Other chronic pain: Secondary | ICD-10-CM | POA: Insufficient documentation

## 2014-02-10 LAB — CBC WITH DIFFERENTIAL/PLATELET
BASOS PCT: 0 % (ref 0–1)
Basophils Absolute: 0 10*3/uL (ref 0.0–0.1)
EOS PCT: 0 % (ref 0–5)
Eosinophils Absolute: 0 10*3/uL (ref 0.0–0.7)
HEMATOCRIT: 32.2 % — AB (ref 39.0–52.0)
HEMOGLOBIN: 10.9 g/dL — AB (ref 13.0–17.0)
Lymphocytes Relative: 6 % — ABNORMAL LOW (ref 12–46)
Lymphs Abs: 1 10*3/uL (ref 0.7–4.0)
MCH: 29.9 pg (ref 26.0–34.0)
MCHC: 33.9 g/dL (ref 30.0–36.0)
MCV: 88.2 fL (ref 78.0–100.0)
MONOS PCT: 4 % (ref 3–12)
Monocytes Absolute: 0.7 10*3/uL (ref 0.1–1.0)
NEUTROS ABS: 14.4 10*3/uL — AB (ref 1.7–7.7)
Neutrophils Relative %: 90 % — ABNORMAL HIGH (ref 43–77)
Platelets: 264 10*3/uL (ref 150–400)
RBC: 3.65 MIL/uL — ABNORMAL LOW (ref 4.22–5.81)
RDW: 14.5 % (ref 11.5–15.5)
WBC: 16.1 10*3/uL — ABNORMAL HIGH (ref 4.0–10.5)

## 2014-02-10 LAB — URINALYSIS, ROUTINE W REFLEX MICROSCOPIC
BILIRUBIN URINE: NEGATIVE
GLUCOSE, UA: NEGATIVE mg/dL
Ketones, ur: NEGATIVE mg/dL
Leukocytes, UA: NEGATIVE
Nitrite: NEGATIVE
PROTEIN: NEGATIVE mg/dL
Specific Gravity, Urine: 1.015 (ref 1.005–1.030)
Urobilinogen, UA: 0.2 mg/dL (ref 0.0–1.0)
pH: 6 (ref 5.0–8.0)

## 2014-02-10 LAB — COMPREHENSIVE METABOLIC PANEL
ALBUMIN: 3.4 g/dL — AB (ref 3.5–5.2)
ALK PHOS: 86 U/L (ref 39–117)
ALT: 28 U/L (ref 0–53)
ANION GAP: 15 (ref 5–15)
AST: 35 U/L (ref 0–37)
BILIRUBIN TOTAL: 0.5 mg/dL (ref 0.3–1.2)
BUN: 37 mg/dL — ABNORMAL HIGH (ref 6–23)
CHLORIDE: 96 meq/L (ref 96–112)
CO2: 25 mEq/L (ref 19–32)
Calcium: 9 mg/dL (ref 8.4–10.5)
Creatinine, Ser: 1.47 mg/dL — ABNORMAL HIGH (ref 0.50–1.35)
GFR calc Af Amer: 60 mL/min — ABNORMAL LOW (ref >90)
GFR calc non Af Amer: 52 mL/min — ABNORMAL LOW (ref >90)
Glucose, Bld: 134 mg/dL — ABNORMAL HIGH (ref 70–99)
POTASSIUM: 3.3 meq/L — AB (ref 3.7–5.3)
Sodium: 136 mEq/L — ABNORMAL LOW (ref 137–147)
Total Protein: 8 g/dL (ref 6.0–8.3)

## 2014-02-10 LAB — URINE MICROSCOPIC-ADD ON

## 2014-02-10 LAB — LIPASE, BLOOD: Lipase: 7 U/L — ABNORMAL LOW (ref 11–59)

## 2014-02-10 MED ORDER — ONDANSETRON HCL 4 MG/2ML IJ SOLN
4.0000 mg | Freq: Once | INTRAMUSCULAR | Status: AC
Start: 2014-02-10 — End: 2014-02-10
  Administered 2014-02-10: 4 mg via INTRAVENOUS
  Filled 2014-02-10: qty 2

## 2014-02-10 MED ORDER — SODIUM CHLORIDE 0.9 % IJ SOLN
INTRAMUSCULAR | Status: AC
  Filled 2014-02-10: qty 500

## 2014-02-10 MED ORDER — DEXTROSE 5 % IV SOLN
1.0000 g | Freq: Once | INTRAVENOUS | Status: AC
  Administered 2014-02-10: 1 g via INTRAVENOUS
  Filled 2014-02-10: qty 10

## 2014-02-10 MED ORDER — PROMETHAZINE HCL 25 MG PO TABS: 25.0000 mg | ORAL_TABLET | Freq: Four times a day (QID) | ORAL | Status: DC | PRN

## 2014-02-10 MED ORDER — IOHEXOL 300 MG/ML  SOLN
50.0000 mL | Freq: Once | INTRAMUSCULAR | Status: AC | PRN
  Administered 2014-02-10: 50 mL via ORAL

## 2014-02-10 MED ORDER — AZITHROMYCIN 250 MG PO TABS: ORAL_TABLET | ORAL | Status: DC

## 2014-02-10 MED ORDER — SODIUM CHLORIDE 0.9 % IJ SOLN
INTRAMUSCULAR | Status: AC
  Filled 2014-02-10: qty 30

## 2014-02-10 MED ORDER — SODIUM CHLORIDE 0.9 % IV BOLUS (SEPSIS)
500.0000 mL | Freq: Once | INTRAVENOUS | Status: AC
  Administered 2014-02-10: 500 mL via INTRAVENOUS

## 2014-02-10 MED ORDER — DEXTROSE 5 % IV SOLN: 500.0000 mg | Freq: Once | INTRAVENOUS | Status: DC

## 2014-02-10 MED ORDER — IOHEXOL 300 MG/ML  SOLN
100.0000 mL | Freq: Once | INTRAMUSCULAR | Status: AC | PRN
  Administered 2014-02-10: 100 mL via INTRAVENOUS

## 2014-02-10 MED ORDER — FENTANYL CITRATE 0.05 MG/ML IJ SOLN
50.0000 ug | Freq: Once | INTRAMUSCULAR | Status: AC
  Administered 2014-02-10: 50 ug via INTRAVENOUS
  Filled 2014-02-10: qty 2

## 2014-02-10 NOTE — ED Notes (Signed)
Pt states he has had pain in the right chest/rib area for four days. States he has had a clot in his lung but not sure which one. States he is still on xarelto

## 2014-02-10 NOTE — ED Provider Notes (Signed)
CSN: [id number]     Arrival date & time 02/10/14  1410 History   First MD Initiated Contact with Patient 02/10/14 1431     Chief Complaint  Patient presents with  . Abdominal Pain     (Consider location/radiation/quality/duration/timing/severity/associated sxs/prior Treatment) HPI...Marland Kitchen.Marland Kitchen.Marland Kitchen. right upper quadrant pain for 4 days with associated cough. No fever or chills. No chest pain or dyspnea. Patient is presently on Xarelto secondary to a pulmonary was approximately 1 year ago. Patient is eating. Severity is mild to moderate. Nothing makes symptoms better or worse. No radiation of pain.  Past Medical History  Diagnosis Date  . Hypertension   . Acid reflux   . PE (pulmonary embolism)     in Febuary of this year  . Chronic back pain   . 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 Back surgery      l fusion with rods  . Esophagogastroduodenoscopy (egd) with propofol N/A 07/03/2013    Procedure: ESOPHAGOGASTRODUODENOSCOPY (EGD) WITH PROPOFOL;  Surgeon: Malissa HippoNajeeb U Rehman, MD;  Location: [facility];  Service: Endoscopy;  Laterality: N/A;  . Colonoscopy with propofol N/A 07/03/2013    Procedure: COLONOSCOPY WITH PROPOFOL;  Surgeon: Malissa HippoNajeeb U Rehman, MD;  Location: [facility];  Service: Endoscopy;  Laterality: N/A;  in cecum at 0759; withdrawal time 11 minutes   Family History  Problem Relation Age of Onset  . Pancreatic cancer Mother   . Pancreatic cancer Father    History  Substance Use Topics  . Smoking status: Former Smoker -- 1.00 packs/day for 2 years    Types: Cigarettes    Quit date: 05/26/2013  . Smokeless tobacco: Never Used     Comment: quit one month ago after smoking 16 yrs. 2 pack a day  . Alcohol Use: No    Review of Systems  All other systems reviewed and are negative.     Allergies  Morphine and related and Sudafed  Home Medications   Prior to Admission  medications   Medication Sig Start Date End Date Taking? Authorizing Provider  ferrous sulfate (FERROUSUL) 325 (65 FE) MG tablet Take 1 tablet (325 mg total) by mouth daily with breakfast. Patient taking differently: Take 325 mg by mouth 3 (three) times daily with meals.  07/03/13  Yes Malissa HippoNajeeb U Rehman, MD  losartan-hydrochlorothiazide (HYZAAR) 100-25 MG per tablet Take 1 tablet by mouth daily. 04/03/13  Yes Historical Provider, MD  metoprolol tartrate (LOPRESSOR) 25 MG tablet Take 25 mg by mouth daily as needed (if blood pressure high).  04/05/13  Yes Historical Provider, MD  OxyCODONE HCl ER (OXYCONTIN) 60 MG T12A Take 60 mg by mouth every 12 (twelve) hours.   Yes Historical Provider, MD  pantoprazole (PROTONIX) 40 MG tablet Take 40 mg by mouth 2 (two) times daily before a meal.   Yes Historical Provider, MD  Rivaroxaban (XARELTO) 20 MG TABS tablet Take 1 tablet (20 mg total) by mouth daily. To be started once 15mg  bid dosing is complete Patient taking differently: Take 20 mg by mouth daily.  05/11/13  Yes Erick BlinksJehanzeb Memon, MD  zolpidem (AMBIEN) 10 MG tablet Take 10 mg by mouth daily as needed for sleep.    Yes Historical Provider, MD  azithromycin (ZITHROMAX Z-PAK) 250 MG tablet 2 po day one, then 1 daily x 4 days 02/10/14   Donnetta HutchingBrian Kashus Karlen, MD  pantoprazole (  PROTONIX) 40 MG tablet TAKE ONE TABLET BY MOUTH TWICE DAILY BEFORE MEALS Patient not taking: Reported on 02/10/2014 12/20/13   Malissa HippoNajeeb U Rehman, MD  promethazine (PHENERGAN) 25 MG tablet Take 1 tablet (25 mg total) by mouth every 6 (six) hours as needed. 02/10/14   Donnetta HutchingBrian Chamar Broughton, MD   BP 100/62 mmHg  Pulse 86  Temp(Src) 98.2 F (36.8 C) (Oral)  Resp 16  Ht 5\' 9"  (1.753 m)  Wt 214 lb (97.07 kg)  BMI 31.59 kg/m2  SpO2 92% Physical Exam  Constitutional: He is oriented to person, place, and time. He appears well-developed and well-nourished.  HENT:  Head: Normocephalic and atraumatic.  Eyes: Conjunctivae and EOM are normal. Pupils are equal, round, and  reactive to light.  Neck: Normal range of motion. Neck supple.  Cardiovascular: Normal rate, regular rhythm and normal heart sounds.   Pulmonary/Chest: Effort normal and breath sounds normal.  Abdominal: Soft. Bowel sounds are normal.  Minimal right upper quadrant tenderness  Musculoskeletal: Normal range of motion.  Neurological: He is alert and oriented to person, place, and time.  Skin: Skin is warm and dry.  Psychiatric: He has a normal mood and affect. His behavior is normal.  Nursing note and vitals reviewed.   ED Course  Procedures (including critical care time) Labs Review Labs Reviewed  COMPREHENSIVE METABOLIC PANEL - Abnormal; Notable for the following:    Sodium 136 (*)    Potassium 3.3 (*)    Glucose, Bld 134 (*)    BUN 37 (*)    Creatinine, Ser 1.47 (*)    Albumin 3.4 (*)    GFR calc non Af Amer 52 (*)    GFR calc Af Amer 60 (*)    All other components within normal limits  CBC WITH DIFFERENTIAL - Abnormal; Notable for the following:    WBC 16.1 (*)    RBC 3.65 (*)    Hemoglobin 10.9 (*)    HCT 32.2 (*)    Neutrophils Relative % 90 (*)    Neutro Abs 14.4 (*)    Lymphocytes Relative 6 (*)    All other components within normal limits  LIPASE, BLOOD - Abnormal; Notable for the following:    Lipase 7 (*)    All other components within normal limits  URINALYSIS, ROUTINE W REFLEX MICROSCOPIC - Abnormal; Notable for the following:    APPearance HAZY (*)    Hgb urine dipstick MODERATE (*)    All other components within normal limits  URINE MICROSCOPIC-ADD ON - Abnormal; Notable for the following:    Bacteria, UA FEW (*)    All other components within normal limits    Imaging Review Ct Abdomen Pelvis W Contrast  02/10/2014   CLINICAL DATA:  53 year old male with intermittent right upper quadrant abdominal pain for the past 4-5 days. Constipation with intermittent nausea and vomiting during the same time period. Pain increased with activity.  EXAM: CT ABDOMEN AND  PELVIS WITH CONTRAST  TECHNIQUE: Multidetector CT imaging of the abdomen and pelvis was performed using the standard protocol following bolus administration of intravenous contrast.  CONTRAST:  100mL OMNIPAQUE IOHEXOL 300 MG/ML SOLN, 50mL OMNIPAQUE IOHEXOL 300 MG/ML SOLN  COMPARISON:  No priors.  FINDINGS: Lower chest: Extensive peribronchovascular airspace consolidation throughout the lung bases bilaterally (right greater than left), concerning for multilobar bronchopneumonia. Moderate hiatal hernia. Multiple borderline enlarged and mildly enlarged lower mediastinal lymph nodes, most evident in the paraesophageal region, measuring up to 12 mm in short axis. Calcifications of  the aortic valve.  Hepatobiliary: No cystic for solid hepatic lesion. No intra or extrahepatic biliary ductal dilatation. Gallbladder is completely decompressed, but otherwise unremarkable in appearance.  Pancreas: Unremarkable.  Spleen: Unremarkable.  Adrenals/Urinary Tract: Bilateral adrenal glands and bilateral kidneys are normal in appearance. No hydroureteronephrosis. Urinary bladder is normal in appearance.  Stomach/Bowel: The appearance of the stomach is normal. Normal appendix. No pathologic dilatation of small bowel or colon.  Vascular/Lymphatic: Mild atherosclerosis throughout the abdominal and pelvic vasculature, without evidence of aneurysm or dissection. Circumaortic left renal vein (normal anatomical variant) incidentally noted. Multiple borderline enlarged and minimally enlarged retroperitoneal lymph nodes, largest of which measures only 1 cm in short axis in the left para-aortic nodal station inferior to the left renal hilum (image 42 of series 2).  Reproductive: Prostate gland and seminal vesicles are unremarkable in appearance.  Other: No significant volume of ascites.  No pneumoperitoneum.  Musculoskeletal: Status post PLIF at L5-S1. There are no aggressive appearing lytic or blastic lesions noted in the visualized portions  of the skeleton.  IMPRESSION: 1. No acute findings in the abdomen or pelvis. 2. However, there is extensive peribronchovascular airspace disease throughout the visualized lung bases bilaterally, concerning for severe multilobar bronchopneumonia. Some of this could also be accounted for by aspiration given the patient's recent history of vomiting, however, this is not strongly favored. 3. Multiple borderline enlarged and mildly enlarged lower mediastinal and retroperitoneal lymph nodes. These findings are nonspecific, and favored to be reactive. 4. Additional incidental findings, as above.   Electronically Signed   By: Trudie Reed M.D.   On: 02/10/2014 17:35     EKG Interpretation None      MDM   Final diagnoses:  Community acquired pneumonia    No acute abdomen. Normal vital signs. CT abdomen and pelvis shows a extensive peribronchovascular airspace disease consistent with pneumonia. Patient is hemodynamically stable. IV Rocephin. IV Zithromax. Discharge home on po Zithromax and Phenergan 25 mg.  Results of test discussed with patient and his children.    Donnetta Hutching, MD 02/10/14 2051

## 2014-02-10 NOTE — ED Notes (Signed)
EDP at bedside  

## 2014-02-10 NOTE — ED Notes (Signed)
Pt drinking PO contrast. Pt tolerating well.

## 2014-02-10 NOTE — ED Notes (Signed)
Initial [facility] bolus completed[facility] initial bolus infused from.

## 2014-02-10 NOTE — ED Notes (Signed)
Pt states he has been here to long. States if he does not get his report he is going home. EDP aware

## 2014-02-10 NOTE — Discharge Instructions (Signed)
Chest x-ray shows pneumonia. Start prescription for antibiotic tomorrow morning. Also prescription for nausea.

## 2014-03-23 ENCOUNTER — Ambulatory Visit (HOSPITAL_COMMUNITY)
Admission: RE | Admit: 2014-03-23 | Discharge: 2014-03-23 | Disposition: A | Discharge status: Home / Self Care | Payer: Medicare Other | Source: Ambulatory Visit | Attending: Adult Health Nurse Practitioner | Admitting: Adult Health Nurse Practitioner

## 2014-03-23 ENCOUNTER — Other Ambulatory Visit (HOSPITAL_COMMUNITY): Payer: Self-pay | Admitting: Adult Health Nurse Practitioner

## 2014-03-23 DIAGNOSIS — I2699 Other pulmonary embolism without acute cor pulmonale: Secondary | ICD-10-CM

## 2014-03-23 DIAGNOSIS — K219 Gastro-esophageal reflux disease without esophagitis: Secondary | ICD-10-CM | POA: Diagnosis not present

## 2014-03-23 DIAGNOSIS — I10 Essential (primary) hypertension: Secondary | ICD-10-CM | POA: Insufficient documentation

## 2014-03-23 DIAGNOSIS — I82402 Acute embolism and thrombosis of unspecified deep veins of left lower extremity: Secondary | ICD-10-CM

## 2014-03-23 DIAGNOSIS — Z87891 Personal history of nicotine dependence: Secondary | ICD-10-CM | POA: Diagnosis not present

## 2014-03-23 LAB — POCT I-STAT CREATININE: Creatinine, Ser: 1.4 mg/dL — ABNORMAL HIGH (ref 0.50–1.35)

## 2014-03-23 MED ORDER — IOHEXOL 350 MG/ML SOLN
100.0000 mL | Freq: Once | INTRAVENOUS | Status: AC | PRN
  Administered 2014-03-23: 100 mL via INTRAVENOUS

## 2014-04-26 ENCOUNTER — Other Ambulatory Visit (INDEPENDENT_AMBULATORY_CARE_PROVIDER_SITE_OTHER): Payer: Self-pay | Admitting: *Deleted

## 2014-04-26 ENCOUNTER — Encounter (INDEPENDENT_AMBULATORY_CARE_PROVIDER_SITE_OTHER): Payer: Self-pay | Admitting: *Deleted

## 2014-04-26 DIAGNOSIS — D509 Iron deficiency anemia, unspecified: Secondary | ICD-10-CM

## 2014-05-18 LAB — CBC
HCT: 37.9 % — ABNORMAL LOW (ref 39.0–52.0)
Hemoglobin: 12.5 g/dL — ABNORMAL LOW (ref 13.0–17.0)
MCH: 28.8 pg (ref 26.0–34.0)
MCHC: 33 g/dL (ref 30.0–36.0)
MCV: 87.3 fL (ref 78.0–100.0)
MPV: 10.2 fL (ref 8.6–12.4)
PLATELETS: 381 10*3/uL (ref 150–400)
RBC: 4.34 MIL/uL (ref 4.22–5.81)
RDW: 15.3 % (ref 11.5–15.5)
WBC: 10.9 10*3/uL — AB (ref 4.0–10.5)

## 2014-05-21 ENCOUNTER — Encounter (INDEPENDENT_AMBULATORY_CARE_PROVIDER_SITE_OTHER): Payer: Self-pay | Admitting: Internal Medicine

## 2014-05-21 ENCOUNTER — Ambulatory Visit (INDEPENDENT_AMBULATORY_CARE_PROVIDER_SITE_OTHER): Payer: Medicare Other | Admitting: Internal Medicine

## 2014-05-21 VITALS — BP 102/66 | HR 70 | Temp 98.0°F | Resp 18 | Ht 69.0 in | Wt 206.7 lb

## 2014-05-21 DIAGNOSIS — D509 Iron deficiency anemia, unspecified: Secondary | ICD-10-CM | POA: Diagnosis not present

## 2014-05-21 DIAGNOSIS — K2100 Gastro-esophageal reflux disease with esophagitis, without bleeding: Secondary | ICD-10-CM

## 2014-05-21 MED ORDER — PANTOPRAZOLE SODIUM 40 MG PO TBEC: 40.0000 mg | DELAYED_RELEASE_TABLET | Freq: Every day | ORAL | Status: DC

## 2014-05-21 MED ORDER — FERROUS SULFATE 325 (65 FE) MG PO TABS: 325.0000 mg | ORAL_TABLET | Freq: Two times a day (BID) | ORAL | Status: DC

## 2014-05-21 NOTE — Patient Instructions (Signed)
Will request copy of blood work and Dr. Lysbeth GalasNyland office for review. Call if you have breakthrough symptoms with single dose of pantoprazole.

## 2014-05-21 NOTE — Progress Notes (Signed)
Presenting complaint;  Follow-up regarding iron deficiency anemia.  Subjective:  Patient is 54 year old Caucasian male who is here for scheduled visit. He was last seen 6 months ago. He has history of erosive reflux esophagitis and deficiency anemia. He states he developed flu complicated by pneumonia and was hospitalized in December 2015. He feels well. He rarely has heartburn. He denies dysphagia nausea vomiting melena or rectal bleeding. His stools have been Dr. since he has been on iron. He states his level was low and Dr. Lysbeth Galas asked him to increase iron pills to 3 a day. He walks 1.5  mile every day. He has occasional midabdominal pain which does not last long. His appetite is good and he has lost 2 pounds since his last visit.   Current Medications: Outpatient Encounter Prescriptions as of 05/21/2014  Medication Sig  . aspirin 81 MG tablet Take 81 mg by mouth daily.  . ferrous sulfate (FERROUSUL) 325 (65 FE) MG tablet Take 1 tablet (325 mg total) by mouth daily with breakfast. (Patient taking differently: Take 325 mg by mouth 3 (three) times daily with meals. )  . losartan-hydrochlorothiazide (HYZAAR) 100-25 MG per tablet Take 1 tablet by mouth daily.  . metFORMIN (GLUCOPHAGE-XR) 500 MG 24 hr tablet Take 500 mg by mouth daily with breakfast.   . metoprolol tartrate (LOPRESSOR) 25 MG tablet Take 25 mg by mouth daily as needed (if blood pressure high).   . OxyCODONE HCl ER (OXYCONTIN) 60 MG T12A Take 60 mg by mouth every 12 (twelve) hours.  . pantoprazole (PROTONIX) 40 MG tablet Take 40 mg by mouth 2 (two) times daily before a meal.  . promethazine (PHENERGAN) 25 MG tablet Take 1 tablet (25 mg total) by mouth every 6 (six) hours as needed.  . zolpidem (AMBIEN) 10 MG tablet Take 10 mg by mouth daily as needed for sleep.   . [DISCONTINUED] azithromycin (ZITHROMAX Z-PAK) 250 MG tablet 2 po day one, then 1 daily x 4 days (Patient not taking: Reported on 05/21/2014)  . [DISCONTINUED]  pantoprazole (PROTONIX) 40 MG tablet TAKE ONE TABLET BY MOUTH TWICE DAILY BEFORE MEALS (Patient not taking: Reported on 02/10/2014)  . [DISCONTINUED] Rivaroxaban (XARELTO) 20 MG TABS tablet Take 1 tablet (20 mg total) by mouth daily. To be started once  bid dosing is complete (Patient not taking: Reported on 05/21/2014)     Objective: Blood pressure 102/66, pulse 70, temperature 98 F (36.7 C), temperature source Oral, resp. rate 18, height  (1.753 m), weight 206 lb 11.2 oz (93.759 kg). Patient is alert and in no acute distress. Conjunctiva is pink. Sclera is nonicteric Oropharyngeal mucosa is normal. No neck masses or thyromegaly noted. Cardiac exam with regular rhythm normal S1 and S2. No murmur or gallop noted. Lungs are clear to auscultation. Abdomen is full but soft and nontender without organomegaly or masses. No LE edema or clubbing noted.  Labs/studies Results: Lab data from 05/18/2014. WBC 10.9, H&H 12.5 and 37.9 and platelet count 381K. H&H on 02/10/2014 was 10.9 and 32.2.  Assessment:  #1. Gastroesophageal reflux disease. EGD in April last year revealed erosive/ulcerative reflux esophagitis and small hernia. Symptoms well-controlled with therapy. I believe. PR dose could be reduced. #2. Iron deficiency anemia. He had EGD and endoscopy in April 2015 when he presented with evidence of occult GI bleed after having been on anticoagulant for pulmonary embolism. He is not off anticoagulant. No evidence of active GI bleed. H&H is not normal yet. #3. Mild gastropathy noted on EGD  of April 2015 ultrasound of May 2015 was negative for changes of cirrhosis or splenomegaly and abdominopelvic CT of 02/10/2014 did not show any changes of cirrhosis or portal hypertension.   Plan:  Decrease pantoprazole to 40 mg by mouth every morning. Call for breakthrough symptoms. Decrease ferrous sulfate 325 mg by mouth twice a day. Will request a copy of recent blood work from Dr. Joyce CopaNyland's  office. Office visit in 6 months.

## 2014-06-21 ENCOUNTER — Encounter (INDEPENDENT_AMBULATORY_CARE_PROVIDER_SITE_OTHER): Payer: Self-pay

## 2014-09-27 ENCOUNTER — Encounter (INDEPENDENT_AMBULATORY_CARE_PROVIDER_SITE_OTHER): Payer: Self-pay | Admitting: *Deleted

## 2014-11-19 ENCOUNTER — Ambulatory Visit (INDEPENDENT_AMBULATORY_CARE_PROVIDER_SITE_OTHER): Payer: Medicare Other | Admitting: Internal Medicine

## 2014-11-26 ENCOUNTER — Ambulatory Visit (INDEPENDENT_AMBULATORY_CARE_PROVIDER_SITE_OTHER): Payer: Medicare Other | Admitting: Internal Medicine

## 2014-12-18 ENCOUNTER — Ambulatory Visit (INDEPENDENT_AMBULATORY_CARE_PROVIDER_SITE_OTHER): Payer: Medicare Other | Admitting: Internal Medicine

## 2014-12-31 ENCOUNTER — Encounter (INDEPENDENT_AMBULATORY_CARE_PROVIDER_SITE_OTHER): Payer: Self-pay | Admitting: Internal Medicine

## 2014-12-31 ENCOUNTER — Ambulatory Visit (INDEPENDENT_AMBULATORY_CARE_PROVIDER_SITE_OTHER): Payer: Medicare Other | Admitting: Internal Medicine

## 2014-12-31 VITALS — BP 102/68 | HR 70 | Temp 98.7°F | Resp 18 | Ht 69.0 in | Wt 187.1 lb

## 2014-12-31 DIAGNOSIS — K760 Fatty (change of) liver, not elsewhere classified: Secondary | ICD-10-CM | POA: Diagnosis not present

## 2014-12-31 DIAGNOSIS — K2100 Gastro-esophageal reflux disease with esophagitis, without bleeding: Secondary | ICD-10-CM

## 2014-12-31 DIAGNOSIS — D509 Iron deficiency anemia, unspecified: Secondary | ICD-10-CM

## 2014-12-31 MED ORDER — DEXLANSOPRAZOLE 60 MG PO CPDR: 60.0000 mg | DELAYED_RELEASE_CAPSULE | Freq: Every day | ORAL | Status: DC

## 2014-12-31 NOTE — Progress Notes (Signed)
Presenting complaint;  Follow-up for GERD iron deficiency anemia and fatty liver.  Subjective:  Patient is 54 year old Caucasian male who is here for scheduled visit. He was last seen in March 2016. He complains of frequent heartburn. He feels pantoprazole is not working. He was on Nexium last year. Nexium work but he was not on his plan. He is watching his diet very closely. He drinks one cup of coffee a day he does not drink [facility] anymore. He walks 3-4 miles every day. He has lost 19 pounds. He is hoping that his back pain could be managed on lesser dose oxycodone. He denies nausea vomiting or dysphagia. His stool is black because he is an iron. He denies rectal bleeding. He had Hemoccult by Dr. Lysbeth GalasNyland about 5 months ago and was negative. States he had blood work by Dr. Lysbeth GalasNyland few weeks ago but does not know the result. He is scheduled to have some low blood work later this year.   Current Medications: Outpatient Encounter Prescriptions as of 12/31/2014  Medication Sig  . aspirin 81 MG tablet Take 81 mg by mouth daily.  . COMBIVENT RESPIMAT 20-100 MCG/ACT AERS respimat 1 puff every 6 (six) hours as needed.   . ferrous sulfate (FERROUSUL) 325 (65 FE) MG tablet Take 1 tablet (325 mg total) by mouth 2 (two) times daily with a meal.  . losartan-hydrochlorothiazide (HYZAAR) 100-25 MG per tablet Take 1 tablet by mouth daily.  . metFORMIN (GLUCOPHAGE-XR) 500 MG 24 hr tablet Take 500 mg by mouth daily with breakfast.   . metoprolol tartrate (LOPRESSOR) 25 MG tablet Take 25 mg by mouth daily as needed (if blood pressure high).   . OxyCODONE HCl ER (OXYCONTIN) 60 MG T12A Take 60 mg by mouth every 12 (twelve) hours.  . pantoprazole (PROTONIX) 40 MG tablet Take 1 tablet (40 mg total) by mouth daily before breakfast.  . pravastatin (PRAVACHOL) 40 MG tablet Take 40 mg by mouth daily.   . promethazine (PHENERGAN) 25 MG tablet Take 1 tablet (25 mg total) by mouth every 6 (six) hours as needed.  .  zolpidem (AMBIEN) 10 MG tablet Take 10 mg by mouth daily as needed for sleep.    No facility-administered encounter medications on file as of 12/31/2014.     Objective: Blood pressure 102/68, pulse 70, temperature 98.7 F (37.1 C), temperature source Oral, resp. rate 18, height 5\' 9"  (1.753 m), weight 187 lb 1.6 oz (84.868 kg). Patient is alert and in no acute distress. Conjunctiva is pink. Sclera is nonicteric Oropharyngeal mucosa is normal. No neck masses or thyromegaly noted. Cardiac exam with regular rhythm normal S1 and S2. No murmur or gallop noted. Lungs are clear to auscultation. Abdomen is symmetrical soft and nontender without organomegaly or masses. No LE edema or clubbing noted.  Labs/studies Results: Recent lab requested from Dr. Joyce CopaNyland's office    Assessment:  #1. GERD. Patient is having heartburn more than 4 times a week. He has history of erosive reflux esophagitis. Last EGD was in April 2015. He is watching his diet and has quit drinking [facility]. He may have an element of gastroparesis secondary to narcotic. Interchanging PPI does not help will consider emptying study. #2. History of iron deficiency anemia. Hemoglobin in March 2016 was 12.5. . He had EGD and colonoscopy in April last year when he was found to have erosive reflux esophagitis and mild portal gastropathy and colonoscopy was normal. Hemoglobin in March this year was 12.5. Once H&H is normal  will drop ferrous sulfate dose to one pill a day. #3. Fatty liver. Well-documented on ultrasound of May 2015. Transaminases have remained normal. EGD in May last year revealed mild portal gastropathy but he does not have stigmata of chronic liver disease(no evidence of splenomegaly on CT of December 2015). He is walking every day and has lost 19 pounds which should help great deal.   Plan:  Discontinue pantoprazole as it is not working. Dexilant 60 mg by mouth every morning. Patient will call office if Dexilant  does not work. Will requests copy of recent blood work from Dr. Joyce Copa office for review. Office visit in one year.

## 2014-12-31 NOTE — Patient Instructions (Addendum)
Call if Dexilant co-pay is too high or if it does not work. Will obtain copy of recent blood work from Dr. Joyce CopaNyland's office for review

## 2015-05-16 ENCOUNTER — Encounter (INDEPENDENT_AMBULATORY_CARE_PROVIDER_SITE_OTHER): Payer: Self-pay

## 2015-06-03 ENCOUNTER — Encounter (HOSPITAL_COMMUNITY): Payer: Self-pay | Admitting: Emergency Medicine

## 2015-06-03 ENCOUNTER — Emergency Department (HOSPITAL_COMMUNITY)
Admission: EM | Admit: 2015-06-03 | Discharge: 2015-06-03 | Disposition: A | Discharge status: Home / Self Care | Payer: Medicare Other | Attending: Emergency Medicine | Admitting: Emergency Medicine

## 2015-06-03 DIAGNOSIS — Z87891 Personal history of nicotine dependence: Secondary | ICD-10-CM | POA: Diagnosis not present

## 2015-06-03 DIAGNOSIS — M546 Pain in thoracic spine: Secondary | ICD-10-CM | POA: Diagnosis not present

## 2015-06-03 DIAGNOSIS — I10 Essential (primary) hypertension: Secondary | ICD-10-CM | POA: Diagnosis not present

## 2015-06-03 DIAGNOSIS — E119 Type 2 diabetes mellitus without complications: Secondary | ICD-10-CM | POA: Diagnosis not present

## 2015-06-03 DIAGNOSIS — G8929 Other chronic pain: Secondary | ICD-10-CM | POA: Diagnosis not present

## 2015-06-03 MED ORDER — PREDNISONE 10 MG PO TABS
20.0000 mg | ORAL_TABLET | Freq: Two times a day (BID) | ORAL | Status: DC
Start: 2015-06-03 — End: 2015-12-24

## 2015-06-03 MED ORDER — HYDROCODONE-ACETAMINOPHEN 5-325 MG PO TABS: 2.0000 | ORAL_TABLET | ORAL | Status: DC | PRN

## 2015-06-03 MED ORDER — PREDNISONE 20 MG PO TABS
40.0000 mg | ORAL_TABLET | Freq: Once | ORAL | Status: AC
  Administered 2015-06-03: 40 mg via ORAL
  Filled 2015-06-03: qty 2

## 2015-06-03 MED ORDER — HYDROCODONE-ACETAMINOPHEN 5-325 MG PO TABS: 1.0000 | ORAL_TABLET | ORAL | Status: DC | PRN

## 2015-06-03 NOTE — ED Provider Notes (Signed)
CSN: [id number]     Arrival date & time 06/03/15  1802 History   First MD Initiated Contact with Patient 06/03/15 2044     Chief Complaint  Patient presents with  . Back Pain     (Consider location/radiation/quality/duration/timing/severity/associated sxs/prior Treatment) Patient is a 55 y.o. male presenting with back pain. The history is provided by the patient.  Back Pain Location:  Thoracic spine Quality:  Shooting Radiates to:  Does not radiate Pain severity:  Moderate Pain is:  Worse during the night Onset quality:  Gradual Duration:  3 days Timing:  Constant Progression:  Unchanged Chronicity:  New Relieved by:  Nothing Worsened by:  Nothing tried Ineffective treatments: patient took one motrin gel cap at 11 am. Associated symptoms: no bladder incontinence, no bowel incontinence and no dysuria    William Burch is a 55 y.o. male with hx of chronic back pain and hx of fusion L4-5 presents to the ED with 3 day hx of exacerbation of his chronic pain. He denies any recent injury.    Past Medical History  Diagnosis Date  . Hypertension   . Acid reflux   . PE (pulmonary embolism)     in Febuary of this year  . Chronic back pain   . 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 Diabetes [facility])    Past Surgical History  Procedure Laterality Date  . Back surgery      l fusion with rods  . Esophagogastroduodenoscopy (egd) with propofol N/A 07/03/2013    Procedure: ESOPHAGOGASTRODUODENOSCOPY (EGD) WITH PROPOFOL;  Surgeon: Malissa Hippo, MD;  Location: [facility];  Service: Endoscopy;  Laterality: N/A;  . Colonoscopy with propofol N/A 07/03/2013    Procedure: COLONOSCOPY WITH PROPOFOL;  Surgeon: Malissa Hippo, MD;  Location: [facility];  Service: Endoscopy;  Laterality: N/A;  in cecum at 0759; withdrawal time 11 minutes   Family History  Problem Relation Age of Onset  . Pancreatic cancer Mother   . Pancreatic cancer  Father    Social History  Substance Use Topics  . Smoking status: Former Smoker -- 1.00 packs/day for 2 years    Types: Cigarettes    Quit date: 05/26/2013  . Smokeless tobacco: Never Used     Comment: quit one month ago after smoking 16 yrs. 2 pack a day  . Alcohol Use: No    Review of Systems  Gastrointestinal: Negative for bowel incontinence.  Genitourinary: Negative for bladder incontinence and dysuria.  Musculoskeletal: Positive for back pain.  all other systems negative    Allergies  Morphine and related and Sudafed  Home Medications   Prior to Admission medications   Medication Sig Start Date End Date Taking? Authorizing Provider  aspirin 81 MG tablet Take 81 mg by mouth daily.    Historical Provider, MD  COMBIVENT RESPIMAT 20-100 MCG/ACT AERS respimat 1 puff every 6 (six) hours as needed.  12/24/14   Historical Provider, MD  dexlansoprazole (DEXILANT) 60 MG capsule Take 1 capsule (60 mg total) by mouth daily. 12/31/14   Malissa Hippo, MD  ferrous sulfate (FERROUSUL) 325 (65 FE) MG tablet Take 1 tablet (325 mg total) by mouth 2 (two) times daily with a meal. 05/21/14   Malissa Hippo, MD  HYDROcodone-acetaminophen (NORCO/VICODIN) 5-325 MG tablet Take 2 tablets by mouth every 4 (four) hours as needed. 06/03/15   Hope Orlene Och, NP  HYDROcodone-acetaminophen (NORCO/VICODIN) 5-325 MG tablet Take 1 tablet by mouth every 4 (four) hours as needed. 06/03/15   Hope Orlene OchM Neese, NP  losartan-hydrochlorothiazide (HYZAAR) 100-25 MG per tablet Take 1 tablet by mouth daily. 04/03/13   Historical Provider, MD  metFORMIN (GLUCOPHAGE-XR) 500 MG 24 hr tablet Take 500 mg by mouth daily with breakfast.  04/03/14 04/03/15  Historical Provider, MD  metoprolol tartrate (LOPRESSOR) 25 MG tablet Take 25 mg by mouth daily as needed (if blood pressure high).  04/05/13   Historical Provider, MD  OxyCODONE HCl ER (OXYCONTIN) 60 MG T12A Take 60 mg by mouth every 12 (twelve) hours.    Historical Provider, MD   pravastatin (PRAVACHOL) 40 MG tablet Take 40 mg by mouth daily.  12/22/14   Historical Provider, MD  predniSONE (DELTASONE) 10 MG tablet Take 2 tablets (20 mg total) by mouth 2 (two) times daily with a meal. 06/03/15   Hope Orlene OchM Neese, NP  promethazine (PHENERGAN) 25 MG tablet Take 1 tablet (25 mg total) by mouth every 6 (six) hours as needed. 02/10/14   Donnetta HutchingBrian Cook, MD  zolpidem (AMBIEN) 10 MG tablet Take 10 mg by mouth daily as needed for sleep.     Historical Provider, MD   BP 143/90 mmHg  Pulse 71  Temp(Src) 97.9 F (36.6 C) (Oral)  Resp 16  Ht 5\' 9"  (1.753 m)  Wt 82.555 kg  BMI 26.86 kg/m2  SpO2 99% Physical Exam  Constitutional: He is oriented to person, place, and time. He appears well-developed and well-nourished. No distress.  HENT:  Head: Normocephalic and atraumatic.  Eyes: EOM are normal. Pupils are equal, round, and reactive to light.  Neck: Normal range of motion. Neck supple.  Cardiovascular: Normal rate and regular rhythm.   Pulmonary/Chest: Effort normal. No respiratory distress. He has no wheezes. He has no rales.  Abdominal: Soft. Bowel sounds are normal. There is no tenderness.  Musculoskeletal: Normal range of motion. He exhibits no edema.       Lumbar back: He exhibits tenderness. He exhibits normal range of motion, no deformity, no spasm and normal pulse.  Neurological: He is alert and oriented to person, place, and time. He has normal strength. No cranial nerve deficit or sensory deficit. Coordination and gait normal.  Reflex Scores:      Bicep reflexes are 2+ on the right side and 2+ on the left side.      Brachioradialis reflexes are 2+ on the right side and 2+ on the left side.      Patellar reflexes are 2+ on the right side and 2+ on the left side.      Achilles reflexes are 2+ on the right side and 2+ on the left side. Ambulatory without foot drag.  Skin: Skin is warm and dry.  Psychiatric: He has a normal mood and affect. His behavior is normal.  Nursing  note and vitals reviewed.   ED Course  Procedures   MDM  55 y.o. male with hx of chronic back pain with acute exacerbation over the past 3 days stable for d/c without focal neuro deficits. Will treat for pain and patient to f/u with his neuro surgeon. Discussed with the patient and all questioned fully answered.     Final diagnoses:  Bilateral thoracic back pain        Janne NapoleonHope M Neese, NP 06/04/15 0145  Samuel JesterKathleen McManus, DO 06/05/15 2352

## 2015-06-03 NOTE — ED Notes (Signed)
PT c/o middle-lower back pain x4 days. PT denies any new injuries. PT ambulatory in triage with NAD noted.

## 2015-06-03 NOTE — Discharge Instructions (Signed)
Follow up with Dr. Lysbeth GalasNyland or Dr. Jeral FruitBotero for further evaluation of your back pain.

## 2015-06-14 MED FILL — Hydrocodone-Acetaminophen Tab 5-325 MG: ORAL | Qty: 6 | Status: AC

## 2015-06-25 ENCOUNTER — Other Ambulatory Visit (INDEPENDENT_AMBULATORY_CARE_PROVIDER_SITE_OTHER): Payer: Self-pay | Admitting: *Deleted

## 2015-06-25 NOTE — Telephone Encounter (Signed)
Dexilant 60 mg take 1 by mouth daily #30 5 refills [facility]. They will make patient aware.

## 2015-10-02 ENCOUNTER — Other Ambulatory Visit (HOSPITAL_COMMUNITY): Payer: Self-pay | Admitting: Adult Health Nurse Practitioner

## 2015-10-02 ENCOUNTER — Ambulatory Visit (HOSPITAL_COMMUNITY)
Admission: RE | Admit: 2015-10-02 | Discharge: 2015-10-02 | Disposition: A | Discharge status: Home / Self Care | Payer: Medicare Other | Source: Ambulatory Visit | Attending: Adult Health Nurse Practitioner | Admitting: Adult Health Nurse Practitioner

## 2015-10-02 DIAGNOSIS — Z86718 Personal history of other venous thrombosis and embolism: Secondary | ICD-10-CM | POA: Insufficient documentation

## 2015-10-02 DIAGNOSIS — M7989 Other specified soft tissue disorders: Secondary | ICD-10-CM

## 2015-10-02 DIAGNOSIS — M79605 Pain in left leg: Secondary | ICD-10-CM

## 2015-10-02 DIAGNOSIS — M79604 Pain in right leg: Secondary | ICD-10-CM

## 2015-11-18 ENCOUNTER — Other Ambulatory Visit (INDEPENDENT_AMBULATORY_CARE_PROVIDER_SITE_OTHER): Payer: Self-pay | Admitting: Internal Medicine

## 2015-11-30 ENCOUNTER — Emergency Department (HOSPITAL_COMMUNITY)
Admission: EM | Admit: 2015-11-30 | Discharge: 2015-11-30 | Disposition: A | Discharge status: Home / Self Care | Payer: Medicare Other | Attending: Emergency Medicine | Admitting: Emergency Medicine

## 2015-11-30 ENCOUNTER — Encounter (HOSPITAL_COMMUNITY): Payer: Self-pay | Admitting: Emergency Medicine

## 2015-11-30 DIAGNOSIS — Z87891 Personal history of nicotine dependence: Secondary | ICD-10-CM | POA: Diagnosis not present

## 2015-11-30 DIAGNOSIS — E119 Type 2 diabetes mellitus without complications: Secondary | ICD-10-CM | POA: Insufficient documentation

## 2015-11-30 DIAGNOSIS — M545 Low back pain: Secondary | ICD-10-CM | POA: Insufficient documentation

## 2015-11-30 DIAGNOSIS — Z79899 Other long term (current) drug therapy: Secondary | ICD-10-CM | POA: Insufficient documentation

## 2015-11-30 DIAGNOSIS — Z7984 Long term (current) use of oral hypoglycemic drugs: Secondary | ICD-10-CM | POA: Insufficient documentation

## 2015-11-30 DIAGNOSIS — I10 Essential (primary) hypertension: Secondary | ICD-10-CM | POA: Diagnosis not present

## 2015-11-30 DIAGNOSIS — G8929 Other chronic pain: Secondary | ICD-10-CM | POA: Insufficient documentation

## 2015-11-30 DIAGNOSIS — M549 Dorsalgia, unspecified: Secondary | ICD-10-CM

## 2015-11-30 MED ORDER — OXYCODONE-ACETAMINOPHEN 5-325 MG PO TABS
1.0000 | ORAL_TABLET | Freq: Once | ORAL | Status: AC
  Administered 2015-11-30: 1 via ORAL
  Filled 2015-11-30: qty 1

## 2015-11-30 MED ORDER — KETOROLAC TROMETHAMINE 60 MG/2ML IM SOLN
60.0000 mg | Freq: Once | INTRAMUSCULAR | Status: AC
  Administered 2015-11-30: 60 mg via INTRAMUSCULAR
  Filled 2015-11-30: qty 2

## 2015-11-30 NOTE — ED Triage Notes (Signed)
Pt reports chronic back pain that flares up from time to time. No known injury. Pt has had prior back sx.

## 2015-11-30 NOTE — ED Provider Notes (Signed)
[facility] Provider Note   CSN: [id number] Arrival date & time: 11/30/15  1418     History   Chief Complaint Chief Complaint  Patient presents with  . Back Pain    HPI JAHSIR RAMA is a 55 y.o. male.  HPI  SAIF PETER is a 55 y.o. male who presents to the Emergency Department complaining of lower back pain that has been worsening for 2 weeks. He reports a history of chronic pain with similar flares intermittently. He describes a sharp tingling pain from his lower back that radiates into his left leg. Pain is worse with walking and excessive standing, improves at rest.  He states that he had a lumbar fusion in 2008 secondary to an injury and followed pain management after his surgery. He denies abdominal pain, fever, chills, numbness or weakness of the lower extremities, or urine or bowel changes.  Past Medical History:  Diagnosis Date  . Acid reflux   . Chronic back pain   . Diabetes (HCC)   . Hypertension   . PE (pulmonary embolism)    in Febuary of this year  . 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 Rectal bleeding 06/19/2013  . Anemia 06/19/2013  . Acute respiratory failure with hypoxia (HCC) 04/19/2013  . PE (pulmonary embolism) 04/19/2013  . CAP (community acquired pneumonia) 04/19/2013  . Acute respiratory failure (HCC) 04/19/2013  . Hypokalemia 04/19/2013  . Renal insufficiency 04/19/2013  . Bradycardia by electrocardiogram 10/20/2012  . HTN (hypertension) 10/20/2012  . Tobacco abuse counseling 10/20/2012    Past Surgical History:  Procedure Laterality Date  . BACK SURGERY     l fusion with rods  . COLONOSCOPY WITH PROPOFOL N/A 07/03/2013   Procedure: COLONOSCOPY WITH PROPOFOL;  Surgeon: Malissa Hippo, MD;  Location: [facility];  Service: Endoscopy;  Laterality: N/A;  in cecum at 0759; withdrawal time 11 minutes  .  ESOPHAGOGASTRODUODENOSCOPY (EGD) WITH PROPOFOL N/A 07/03/2013   Procedure: ESOPHAGOGASTRODUODENOSCOPY (EGD) WITH PROPOFOL;  Surgeon: Malissa Hippo, MD;  Location: [facility];  Service: Endoscopy;  Laterality: N/A;       Home Medications    Prior to Admission medications   Medication Sig Start Date End Date Taking? Authorizing Provider  aspirin 81 MG tablet Take 81 mg by mouth daily.    Historical Provider, MD  COMBIVENT RESPIMAT 20-100 MCG/ACT AERS respimat 1 puff every 6 (six) hours as needed.  12/24/14   Historical Provider, MD  dexlansoprazole (DEXILANT) 60 MG capsule Take 1 capsule (60 mg total) by mouth daily. 12/31/14   Malissa Hippo, MD  ferrous sulfate (FERROUSUL) 325 (65 FE) MG tablet Take 1 tablet (325 mg total) by mouth 2 (two) times daily with a meal. 05/21/14   Malissa Hippo, MD  HYDROcodone-acetaminophen (NORCO/VICODIN) 5-325 MG tablet Take 2 tablets by mouth every 4 (four) hours as needed. 06/03/15   Hope Orlene Och, NP  HYDROcodone-acetaminophen (NORCO/VICODIN) 5-325 MG tablet Take 1 tablet by mouth every 4 (four) hours as needed. 06/03/15   Hope Orlene Och, NP  losartan-hydrochlorothiazide (HYZAAR) 100-25 MG per tablet Take 1 tablet by mouth daily. 04/03/13   Historical Provider, MD  metFORMIN (GLUCOPHAGE-XR) 500 MG 24 hr tablet Take 500 mg by mouth daily with breakfast.  04/03/14 04/03/15  Historical Provider, MD  metoprolol tartrate (LOPRESSOR) 25 MG tablet  Take 25 mg by mouth daily as needed (if blood pressure high).  04/05/13   Historical Provider, MD  OxyCODONE HCl ER (OXYCONTIN) 60 MG T12A Take 60 mg by mouth every 12 (twelve) hours.    Historical Provider, MD  pravastatin (PRAVACHOL) 40 MG tablet Take 40 mg by mouth daily.  12/22/14   Historical Provider, MD  predniSONE (DELTASONE) 10 MG tablet Take 2 tablets (20 mg total) by mouth 2 (two) times daily with a meal. 06/03/15   Hope Orlene Och, NP  promethazine (PHENERGAN) 25 MG tablet Take 1 tablet (25 mg total) by mouth every 6 (six)  hours as needed. 02/10/14   Donnetta Hutching, MD  zolpidem (AMBIEN) 10 MG tablet Take 10 mg by mouth daily as needed for sleep.     Historical Provider, MD    Family History Family History  Problem Relation Age of Onset  . Pancreatic cancer Mother   . Pancreatic cancer Father     Social History Social History  Substance Use Topics  . Smoking status: Former Smoker    Packs/day: 1.00    Years: 2.00    Types: Cigarettes    Quit date: 05/26/2013  . Smokeless tobacco: Never Used     Comment: quit one month ago after smoking 16 yrs. 2 pack a day  . Alcohol use No     Allergies   Morphine and related and Sudafed [pseudoephedrine hcl]   Review of Systems Review of Systems  Constitutional: Negative for fever.  Respiratory: Negative for shortness of breath.   Gastrointestinal: Negative for abdominal pain, constipation and vomiting.  Genitourinary: Negative for decreased urine volume, difficulty urinating, dysuria, flank pain and hematuria.  Musculoskeletal: Positive for back pain. Negative for joint swelling and neck pain.  Skin: Negative for rash.  Neurological: Negative for weakness and numbness.  All other systems reviewed and are negative.    Physical Exam Updated Vital Signs BP 109/71 (BP Location: Left Arm)   Pulse 72   Temp 98.4 F (36.9 C) (Temporal)   Resp 16   Ht 5\' 9"  (1.753 m)   Wt 81.2 kg   SpO2 100%   BMI 26.43 kg/m   Physical Exam  Constitutional: He is oriented to person, place, and time. He appears well-developed and well-nourished. No distress.  HENT:  Head: Normocephalic and atraumatic.  Neck: Normal range of motion. Neck supple.  Cardiovascular: Normal rate, regular rhythm, normal heart sounds and intact distal pulses.   No murmur heard. Pulmonary/Chest: Effort normal and breath sounds normal. No respiratory distress.  Abdominal: Soft. He exhibits no distension and no mass. There is no tenderness. There is no guarding.  Musculoskeletal: He exhibits  tenderness. He exhibits no edema.       Lumbar back: He exhibits tenderness and pain. He exhibits normal range of motion, no swelling, no deformity, no laceration and normal pulse.  ttp of the lower lumbar spine and left paraspinal muscles.  DP pulses are brisk and symmetrical.  Distal sensation intact.  Pt has 5/5 strength against resistance of bilateral lower extremities.     Neurological: He is alert and oriented to person, place, and time. He has normal strength. No sensory deficit. He exhibits normal muscle tone. Coordination and gait normal.  Reflex Scores:      Patellar reflexes are 2+ on the right side and 2+ on the left side.      Achilles reflexes are 2+ on the right side and 2+ on the left side. Skin: Skin is  warm and dry. No rash noted.  Nursing note and vitals reviewed.    ED Treatments / Results  Labs (all labs ordered are listed, but only abnormal results are displayed) Labs Reviewed - No data to display  EKG  EKG Interpretation None       Radiology No results found.  Procedures Procedures (including critical care time)  Medications Ordered in ED Medications  ketorolac (TORADOL) injection 60 mg (not administered)  oxyCODONE-acetaminophen (PERCOCET/ROXICET) 5-325 MG per tablet 1 tablet (1 tablet Oral Given 11/30/15 1525)     Initial Impression / Assessment and Plan / ED Course  I have reviewed the triage vital signs and the nursing notes.  Pertinent labs & imaging results that were available during my care of the patient were reviewed by me and considered in my medical decision making (see chart for details).  Clinical Course    Pt reviewed on the [facility], he received #90 10 mg percocet on 11/01/15 for a 30 day supply  Low back pain that is likely acute on chronic.  No concerning sx's for emergent neurological or infectious process.  No focal neuro deficits, ambulates with a steady gait. Pt stated that he was no longer seeing pain management, but on further  review of his medical record, [facility]  It was mentioned in the note that pt was d/c from his previous pain management for having water in his urine drug screen.  I do not feel that further prescription narcotics are indicated at this time and this was discussed with the pateint and he verbalized understanding.    Final Clinical Impressions(s) / ED Diagnoses   Final diagnoses:  Chronic back pain    New Prescriptions New Prescriptions   No medications on file     Rosey Bathammy Margie Urbanowicz, PA-C 11/30/15 1629    Donnetta HutchingBrian Cook, MD 12/01/15 [phone]

## 2015-11-30 NOTE — Discharge Instructions (Signed)
As discussed, follow up with your primary doctor for further management of your back pain

## 2015-12-24 ENCOUNTER — Encounter (INDEPENDENT_AMBULATORY_CARE_PROVIDER_SITE_OTHER): Payer: Self-pay | Admitting: Internal Medicine

## 2015-12-24 ENCOUNTER — Ambulatory Visit (INDEPENDENT_AMBULATORY_CARE_PROVIDER_SITE_OTHER): Payer: Medicare Other | Admitting: Internal Medicine

## 2015-12-24 VITALS — BP 120/80 | HR 76 | Temp 97.2°F | Resp 18 | Ht 69.0 in | Wt 178.5 lb

## 2015-12-24 DIAGNOSIS — Z862 Personal history of diseases of the blood and blood-forming organs and certain disorders involving the immune mechanism: Secondary | ICD-10-CM | POA: Diagnosis not present

## 2015-12-24 DIAGNOSIS — K2100 Gastro-esophageal reflux disease with esophagitis, without bleeding: Secondary | ICD-10-CM

## 2015-12-24 LAB — IRON AND TIBC
%SAT: 23 % (ref 15–60)
Iron: 68 ug/dL (ref 50–180)
TIBC: 290 ug/dL (ref 250–425)
UIBC: 222 ug/dL (ref 125–400)

## 2015-12-24 LAB — FERRITIN: Ferritin: 185 ng/mL (ref 20–380)

## 2015-12-24 MED ORDER — FERROUS SULFATE 325 (65 FE) MG PO TABS: 325.0000 mg | ORAL_TABLET | Freq: Every day | ORAL | 0 refills | Status: DC

## 2015-12-24 NOTE — Progress Notes (Signed)
Presenting complaint;  Follow-up for GERD and IDA  Subjective:  William Burch is 55 year old Caucasian male who is here for scheduled visit. He was last seen one year ago. He states he has not had any problems with heartburn or regurgitation since he has been on Dexlansoprazole. He is not having any side effects. He has lost another 9 pounds since his last visit. He has been watching his intake. He drinks 9 bottles of water every day and he walks 30 miles a week. He was able to come off narcotic about 3 months ago. He is having much less back pain. He denies nausea vomiting dysphagia or abdominal pain. He generally has 2 formed stools daily. He denies melena or rectal bleeding. He says current iron prescription is costing him over $15 a month. States he had blood work I Dr. Lysbeth Galas earlier this year and his hemoglobin was normal. He states he is not diabetic anymore. Metformin has been discontinued.   Current Medications: Outpatient Encounter Prescriptions as of 12/24/2015  Medication Sig  . aspirin 81 MG tablet Take 81 mg by mouth daily.  . COMBIVENT RESPIMAT 20-100 MCG/ACT AERS respimat 1 puff every 6 (six) hours as needed.   Marland Kitchen dexlansoprazole (DEXILANT) 60 MG capsule Take 1 capsule (60 mg total) by mouth daily.  . ferrous sulfate (FERROUSUL) 325 (65 FE) MG tablet Take 1 tablet (325 mg total) by mouth 2 (two) times daily with a meal.  . losartan-hydrochlorothiazide (HYZAAR) 100-25 MG per tablet Take 1 tablet by mouth daily.  . pravastatin (PRAVACHOL) 40 MG tablet Take 40 mg by mouth daily.   Marland Kitchen zolpidem (AMBIEN) 10 MG tablet Take 10 mg by mouth daily as needed for sleep.   . [DISCONTINUED] HYDROcodone-acetaminophen (NORCO/VICODIN) 5-325 MG tablet Take 2 tablets by mouth every 4 (four) hours as needed. (Patient not taking: Reported on 12/24/2015)  . [DISCONTINUED] HYDROcodone-acetaminophen (NORCO/VICODIN) 5-325 MG tablet Take 1 tablet by mouth every 4 (four) hours as needed. (Patient not taking:  Reported on 12/24/2015)  . [DISCONTINUED] metFORMIN (GLUCOPHAGE-XR) 500 MG 24 hr tablet Take 500 mg by mouth daily with breakfast.   . [DISCONTINUED] metoprolol tartrate (LOPRESSOR) 25 MG tablet Take 25 mg by mouth daily as needed (if blood pressure high).   . [DISCONTINUED] OxyCODONE HCl ER (OXYCONTIN) 60 MG T12A Take 60 mg by mouth every 12 (twelve) hours.  . [DISCONTINUED] predniSONE (DELTASONE) 10 MG tablet Take 2 tablets (20 mg total) by mouth 2 (two) times daily with a meal. (Patient not taking: Reported on 12/24/2015)  . [DISCONTINUED] promethazine (PHENERGAN) 25 MG tablet Take 1 tablet (25 mg total) by mouth every 6 (six) hours as needed. (Patient not taking: Reported on 12/24/2015)   No facility-administered encounter medications on file as of 12/24/2015.    Objective: Blood pressure 120/80, pulse 76, temperature 97.2 F (36.2 C), temperature source Oral, resp. rate 18, height 5\' 9"  (1.753 m), weight 178 lb 8 oz (81 kg). Patient is alert and in no acute distress. Conjunctiva is pink. Sclera is nonicteric Oropharyngeal mucosa is normal. No neck masses or thyromegaly noted. Cardiac exam with regular rhythm normal S1 and S2. No murmur or gallop noted. Lungs are clear to auscultation. Abdomen is symmetrical soft and nontender without organomegaly or masses. No LE edema or clubbing noted.  Labs/studies Results: Recent H&H and iron studies on file.   Assessment:  #1. GERD. Excellent symptom control with current PPI. Given lifestyle modifications and the fact that he has been able to come off narcotic he  may be able to reduce PPI dose in future. #2. H/o IDA. An deficiency anemia was initially discovered in 2015 when he had EGD and colonoscopy. He may have impaired iron absorption secondary to chronic acid suppression. #3. Weight loss. Weight loss appears to be voluntary. Weight loss has resulted in improved glycemic control and he is not on oral glycemic  anymore.    Plan:  Medication list updated. Will go to lab for; H/H. Serum iron, TIBC and ferirtin. OV in 6 months at which time will consider dropping PPI dose.

## 2015-12-24 NOTE — Patient Instructions (Signed)
Remember to take Advil or ibuprofen with food or snack. Physician will call with results of blood test when completed.

## 2016-01-01 ENCOUNTER — Emergency Department (HOSPITAL_COMMUNITY)
Admission: EM | Admit: 2016-01-01 | Discharge: 2016-01-02 | Disposition: A | Discharge status: Home / Self Care | Payer: Medicare Other | Attending: Emergency Medicine | Admitting: Emergency Medicine

## 2016-01-01 ENCOUNTER — Encounter (HOSPITAL_COMMUNITY): Payer: Self-pay | Admitting: *Deleted

## 2016-01-01 DIAGNOSIS — Z7982 Long term (current) use of aspirin: Secondary | ICD-10-CM | POA: Insufficient documentation

## 2016-01-01 DIAGNOSIS — M545 Low back pain, unspecified: Secondary | ICD-10-CM

## 2016-01-01 DIAGNOSIS — G8929 Other chronic pain: Secondary | ICD-10-CM | POA: Insufficient documentation

## 2016-01-01 DIAGNOSIS — I10 Essential (primary) hypertension: Secondary | ICD-10-CM | POA: Insufficient documentation

## 2016-01-01 DIAGNOSIS — Z79899 Other long term (current) drug therapy: Secondary | ICD-10-CM | POA: Diagnosis not present

## 2016-01-01 HISTORY — DX: Dorsalgia, unspecified: M54.9

## 2016-01-01 MED ORDER — PREDNISONE 50 MG PO TABS
60.0000 mg | ORAL_TABLET | Freq: Once | ORAL | Status: AC
  Administered 2016-01-01: 60 mg via ORAL
  Filled 2016-01-01: qty 1

## 2016-01-01 MED ORDER — PREDNISONE 10 MG PO TABS: 20.0000 mg | ORAL_TABLET | Freq: Two times a day (BID) | ORAL | 0 refills | Status: DC

## 2016-01-01 MED ORDER — KETOROLAC TROMETHAMINE 60 MG/2ML IM SOLN
60.0000 mg | Freq: Once | INTRAMUSCULAR | Status: AC
  Administered 2016-01-01: 60 mg via INTRAMUSCULAR
  Filled 2016-01-01: qty 2

## 2016-01-01 NOTE — ED Triage Notes (Signed)
CHRONIC BACK PAIN 

## 2016-01-01 NOTE — ED Provider Notes (Signed)
[facility] Provider Note   CSN: [id number] Arrival date & time: 01/01/16  2050 By signing my name below, I, Levon HedgerElizabeth Hall, attest that this documentation has been prepared under the direction and in the presence of No att. providers found . Electronically Signed: Levon HedgerElizabeth Hall, Scribe. 01/01/2016. 10:45 PM.   History   Chief Complaint Chief Complaint  Patient presents with  . Back Pain   HPI William PurserHenry Ricketts Jr. is a 55 y.o. male with hx of back pain and HTN who presents to the Emergency Department complaining of acute on chronic back pain which began three weeks ago after running out of pain medication. He rates his pain as 7/10 in severity. Pt states he has had chronic back pain since 2008 s/p back injury for which he has had spinal fusion surgery. He recently moved from [facility] and is the process of establishing care at a pain management facility in the area. He notes associated tingling in his left foot and aching, dull pain in his bilateral calves. He denies any urinary or bowel incontinence.   The history is provided by the patient. No language interpreter was used.   Past Medical History:  Diagnosis Date  . Back pain   . Hypertension    There are no active problems to display for this patient.  Past Surgical History:  Procedure Laterality Date  . BACK SURGERY      Home Medications    Prior to Admission medications   Medication Sig Start Date End Date Taking? Authorizing Provider  aspirin EC 81 MG tablet Take 81 mg by mouth daily.   Yes Historical Provider, MD  COMBIVENT RESPIMAT 20-100 MCG/ACT AERS respimat USE ONE PUFF by MOUTH FOUR times daily 12/12/15  Yes Historical Provider, MD  DEXILANT 60 MG capsule Take 1 Capsule by mouth once daily Patient taking differently: Take 1 Capsule by mouth once daily as needed for GERD 11/20/15  Yes Malissa HippoNajeeb U Rehman, MD  losartan-hydrochlorothiazide (HYZAAR) 100-25 MG tablet Take 1 tablet by mouth daily. 12/12/15  Yes Historical  Provider, MD  Pediatric Multivitamins-Iron (FLINTSTONES PLUS IRON PO) Take 1 tablet by mouth daily.   Yes Historical Provider, MD  zolpidem (AMBIEN) 10 MG tablet Take 10 mg by mouth at bedtime. 12/12/15  Yes Historical Provider, MD  predniSONE (DELTASONE) 10 MG tablet Take 2 tablets (20 mg total) by mouth 2 (two) times daily. 01/01/16   Mancel BaleElliott Jeslin Bazinet, MD    Family History No family history on file.  Social History Social History  Substance Use Topics  . Smoking status: Never Smoker  . Smokeless tobacco: Never Used  . Alcohol use No     Allergies   Morphine and related   Review of Systems Review of Systems  Gastrointestinal:       Negative for incontinence  Genitourinary:       Negative for incontinence  Musculoskeletal: Positive for back pain and myalgias.  All other systems reviewed and are negative.  Physical Exam Updated Vital Signs BP 146/91 (BP Location: Right Arm)   Pulse 88   Temp 98.1 F (36.7 C) (Oral)   Resp 16   Ht 5\' 9"  (1.753 m)   Wt 178 lb (80.7 kg)   SpO2 100%   BMI 26.29 kg/m   Physical Exam  Constitutional: He is oriented to person, place, and time. He appears well-developed and well-nourished. No distress.  HENT:  Head: Normocephalic and atraumatic.  Eyes: Conjunctivae are normal.  Cardiovascular: Normal rate.   Pulmonary/Chest: Effort normal.  Abdominal: He exhibits no distension.  Musculoskeletal: Normal range of motion. He exhibits tenderness (Diffuse lumbar tenderness).  Neurological: He is alert and oriented to person, place, and time. No cranial nerve deficit. Coordination normal.  Skin: Skin is warm and dry.  Psychiatric: He has a normal mood and affect.  Nursing note and vitals reviewed.  ED Treatments / Results  DIAGNOSTIC STUDIES:  Oxygen Saturation is 100% on RA, normal by my interpretation.    COORDINATION OF CARE:  10:24 PM Will order Toradol and deltasone. Discussed treatment plan with pt at bedside and pt agreed to plan.    Labs (all labs ordered are listed, but only abnormal results are displayed) Labs Reviewed - No data to display  EKG  EKG Interpretation None       Radiology No results found.  Procedures Procedures (including critical care time)  Medications Ordered in ED Medications  ketorolac (TORADOL) injection 60 mg (60 mg Intramuscular Given 01/01/16 2243)  predniSONE (DELTASONE) tablet 60 mg (60 mg Oral Given 01/01/16 2243)    Initial Impression / Assessment and Plan / ED Course  I have reviewed the triage vital signs and the nursing notes.  Pertinent labs & imaging results that were available during my care of the patient were reviewed by me and considered in my medical decision making (see chart for details).  Clinical Course   Medications  ketorolac (TORADOL) injection 60 mg (60 mg Intramuscular Given 01/01/16 2243)  predniSONE (DELTASONE) tablet 60 mg (60 mg Oral Given 01/01/16 2243)    Patient Vitals for the past 24 hrs:  BP Temp Temp src Pulse Resp SpO2 Height Weight  01/01/16 2058 146/91 98.1 F (36.7 C) Oral 88 16 100 % - -  01/01/16 2054 - - - - - - 5\' 9"  (1.753 m) 178 lb (80.7 kg)    12:33 AM Reevaluation with update and discussion. After initial assessment and treatment, an updated evaluation reveals No further complaints. Findings discussed with patient, all questions answered.Mancel Bale L   Final Clinical Impressions(s) / ED Diagnoses   Final diagnoses:  Chronic low back pain without sciatica, unspecified back pain laterality   Chronic pain with medication noncompliance. Doubt cauda equina syndrome or lumbar myelopathy.  Nursing Notes Reviewed/ Care Coordinated Applicable Imaging Reviewed Interpretation of Laboratory Data incorporated into ED treatment  The patient appears reasonably screened and/or stabilized for discharge and I doubt any other medical condition or other [facility] requiring further screening, evaluation, or treatment in the ED at this time  prior to discharge.  Plan: Home Medications- continue; Home Treatments- rest; return here if the recommended treatment, does not improve the symptoms; Recommended follow up- PCP prn   New Prescriptions Discharge Medication List as of 01/01/2016 11:33 PM    START taking these medications   Details  predniSONE (DELTASONE) 10 MG tablet Take 2 tablets (20 mg total) by mouth 2 (two) times daily., Starting Wed 01/01/2016, Print      I personally performed the services described in this documentation, which was scribed in my presence. The recorded information has been reviewed and is accurate.     Mancel Bale, MD 01/02/16 [phone]

## 2016-01-02 ENCOUNTER — Encounter (INDEPENDENT_AMBULATORY_CARE_PROVIDER_SITE_OTHER): Payer: Self-pay | Admitting: Internal Medicine

## 2016-02-20 ENCOUNTER — Encounter (INDEPENDENT_AMBULATORY_CARE_PROVIDER_SITE_OTHER): Payer: Self-pay | Admitting: Internal Medicine

## 2016-02-20 ENCOUNTER — Encounter (INDEPENDENT_AMBULATORY_CARE_PROVIDER_SITE_OTHER): Payer: Self-pay

## 2016-02-20 ENCOUNTER — Ambulatory Visit (INDEPENDENT_AMBULATORY_CARE_PROVIDER_SITE_OTHER): Payer: Medicare Other | Admitting: Internal Medicine

## 2016-02-20 VITALS — BP 94/60 | HR 72 | Temp 97.7°F | Ht 68.0 in | Wt 184.8 lb

## 2016-02-20 DIAGNOSIS — R195 Other fecal abnormalities: Secondary | ICD-10-CM

## 2016-02-20 DIAGNOSIS — K5904 Chronic idiopathic constipation: Secondary | ICD-10-CM

## 2016-02-20 MED ORDER — LINACLOTIDE 290 MCG PO CAPS: 290.0000 ug | ORAL_CAPSULE | Freq: Every day | ORAL | 5 refills | Status: DC

## 2016-02-20 NOTE — Progress Notes (Signed)
Subjective:    Patient ID: William Burch, male    DOB: [birth year], 55 y.o.   MRN: [id number]  HPI Referred by Dr. Lysbeth GalasNyland for positive stool card. Patient states he has not seen any blood.  He tells me he has been constipated. He saw Dr Lysbeth GalasNyland and states he had not had a BM in over 2 weeks. He has to strain to have a BM. He has been constipated for about 3 weeks.  He was started on a new pain medication about 2 months ago. Denies melena.  He is taking Miralax daily.  He says he had a good BM this morning after taking the Miralax.  His appetite is good. No weight loss. He says his acid reflux is controlled with Dexilant.   Hx of GERD, IDA and fatty liver. Hx of PE back in 2015  Hx of respiratory failure. Hx of chronic back pain.    Quit smoking in July of this year. Was smoking a pack a day.  01/27/2016 H and H 13.0 and 38.5.       07/03/2013.    EGD & Colonoscopy   Indications:  Patient is 55 year old Caucasian male who was found to have iron deficiency anemia and heme positive stool during recent hospitalization for pneumonia and pulmonary embolism. Hemoglobin is improved by 2 g by po iron. He has chronic GERD and maintain her Nexium which he believes is giving him diarrhea and abdominal pain. He is undergoing diagnostic EGD and colonoscopy         EGD findings; Erosive/ulcerative reflux esophagitis with scarring at distal esophagus. Small sliding hiatal hernia. Mild changes of portal gastropathy.  Colonoscopy findings; Normal colonoscopy.  Comments; Significance of portal gastrapthy unclear but will do ultrasound to rule out chronic liver disease. He possibly lost blood from upper GI tract.                                                                                                             Review of Systems Past Medical History:  Diagnosis Date  . Acid reflux   . Back pain   . Chronic back pain   . Diabetes (HCC)   . Hypertension   . PE (pulmonary  embolism)    in Febuary of this year  . 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 BACK SURGERY     l fusion with rods  . BACK SURGERY    . COLONOSCOPY WITH PROPOFOL N/A 07/03/2013   Procedure: COLONOSCOPY WITH PROPOFOL;  Surgeon: Malissa HippoNajeeb U Rehman, MD;  Location: [facility];  Service: Endoscopy;  Laterality: N/A;  in cecum at 0759; withdrawal time 11 minutes  . ESOPHAGOGASTRODUODENOSCOPY (EGD) WITH PROPOFOL N/A 07/03/2013   Procedure: ESOPHAGOGASTRODUODENOSCOPY (EGD) WITH PROPOFOL;  Surgeon: Malissa HippoNajeeb U Rehman, MD;  Location: [facility];  Service: Endoscopy;  Laterality: N/A;    Allergies  Allergen Reactions  .  Morphine And Related Anaphylaxis  . Morphine And Related Other (See Comments)    REACTION: patient states that he was administered this medication during surgery. States that he "about died from it." patient is not sure whether the medication caused the reaction or the amount given.  [name]. Sudafed [Pseudoephedrine Hcl] Rash    Current Outpatient Prescriptions on File Prior to Visit  Medication Sig Dispense Refill  . aspirin 81 MG tablet Take 81 mg by mouth daily.    . COMBIVENT RESPIMAT 20-100 MCG/ACT AERS respimat 1 puff every 6 (six) hours as needed.     Marland Kitchen. DEXILANT 60 MG capsule Take 1 Capsule by mouth once daily (Patient taking differently: Take 1 Capsule by mouth once daily as needed for GERD) 30 capsule 5  . ferrous sulfate (FERROUSUL) 325 (65 FE) MG tablet Take 1 tablet (325 mg total) by mouth daily with breakfast.  0  . ibuprofen (ADVIL,MOTRIN) 200 MG tablet Take 400 mg by mouth daily.    Marland Kitchen. losartan-hydrochlorothiazide (HYZAAR) 100-25 MG per tablet Take 1 tablet by mouth daily.    . Pediatric Multivitamins-Iron (FLINTSTONES PLUS IRON PO) Take 1 tablet by mouth daily.    . pravastatin (PRAVACHOL) 40 MG tablet Take 40 mg by mouth daily.     Marland Kitchen. zolpidem (AMBIEN) 10 MG tablet Take  10 mg by mouth daily as needed for sleep.      No current facility-administered medications on file prior to visit.        Objective:   Physical Exam Blood pressure 94/60, pulse 72, temperature 97.7 F (36.5 C), height 5\' 8"  (1.727 m), weight 184 lb 12.8 oz (83.8 kg).  Alert and oriented. Skin warm and dry. Oral mucosa is moist.   . Sclera anicteric, conjunctivae is pink. Thyroid not enlarged. No cervical lymphadenopathy. Lungs clear. Heart regular rate and rhythm.  Abdomen is soft. Bowel sounds are positive. No hepatomegaly. No abdominal masses felt. No tenderness.  No edema to lower extremities.  Stool brown and guaiac positive.       Assessment & Plan:  Guaiac + stool. Constipation. Suspect stool positive from straining.  Am going to start him on Linzess 290mcg daily. Rx sent to his pharmacy and samples given to patient.  3 stool cards home with patient.  CBC in 3 weeks. OV in 6 weeks. PR in 2 weeks.  CBC in 2 weeks.

## 2016-02-20 NOTE — Patient Instructions (Signed)
OV in 4-6 weeks. Stool cards x 3  Samples of Linzess given to patient.

## 2016-02-27 ENCOUNTER — Telehealth (INDEPENDENT_AMBULATORY_CARE_PROVIDER_SITE_OTHER): Payer: Self-pay | Admitting: *Deleted

## 2016-02-27 ENCOUNTER — Telehealth (INDEPENDENT_AMBULATORY_CARE_PROVIDER_SITE_OTHER): Payer: Self-pay | Admitting: Internal Medicine

## 2016-02-27 ENCOUNTER — Other Ambulatory Visit (INDEPENDENT_AMBULATORY_CARE_PROVIDER_SITE_OTHER): Payer: Self-pay | Admitting: *Deleted

## 2016-02-27 DIAGNOSIS — K5909 Other constipation: Secondary | ICD-10-CM

## 2016-02-27 DIAGNOSIS — K5904 Chronic idiopathic constipation: Secondary | ICD-10-CM | POA: Diagnosis not present

## 2016-02-27 DIAGNOSIS — R195 Other fecal abnormalities: Secondary | ICD-10-CM | POA: Diagnosis not present

## 2016-02-27 NOTE — Telephone Encounter (Signed)
   Diagnosis:    Result(s)   Card 1: Negative:     Card 2: Negative:   Card 3: Negative:    Completed by: Larose Hiresammy Jacqulynn Shappell, LPN   HEMOCCULT SENSA DEVELOPER: MWU#:[id number]   EXPIRATION DATE: 2020-05   HEMOCCULT SENSA CARD:  NUU#:[id number]   EXPIRATION DATE: 07/2018   CARD CONTROL RESULTS:  POSITIVE: Positive  NEGATIVE: Negative    ADDITIONAL COMMENTS: Patient was called with his results. Forwarded to Terri for review and any further recommendations for the patient.

## 2016-02-27 NOTE — Telephone Encounter (Signed)
Patient called, he's checking on his stool studies that he left under the door yesterday.  [phone]

## 2016-02-27 NOTE — Telephone Encounter (Signed)
Patient was called and givnen his negative result on the hemoccult card x3.

## 2016-02-27 NOTE — Telephone Encounter (Signed)
Line is busy.

## 2016-03-04 ENCOUNTER — Other Ambulatory Visit (INDEPENDENT_AMBULATORY_CARE_PROVIDER_SITE_OTHER): Payer: Self-pay | Admitting: *Deleted

## 2016-03-04 ENCOUNTER — Encounter (INDEPENDENT_AMBULATORY_CARE_PROVIDER_SITE_OTHER): Payer: Self-pay | Admitting: *Deleted

## 2016-03-04 DIAGNOSIS — K5909 Other constipation: Secondary | ICD-10-CM

## 2016-03-04 DIAGNOSIS — R195 Other fecal abnormalities: Secondary | ICD-10-CM

## 2016-03-27 LAB — CBC
HCT: 41.6 % (ref 38.5–50.0)
HEMOGLOBIN: 13.6 g/dL (ref 13.2–17.1)
MCH: 29.4 pg (ref 27.0–33.0)
MCHC: 32.7 g/dL (ref 32.0–36.0)
MCV: 89.8 fL (ref 80.0–100.0)
MPV: 10.9 fL (ref 7.5–12.5)
Platelets: 295 10*3/uL (ref 140–400)
RBC: 4.63 MIL/uL (ref 4.20–5.80)
RDW: 14.9 % (ref 11.0–15.0)
WBC: 11.6 10*3/uL — AB (ref 3.8–10.8)

## 2016-04-02 ENCOUNTER — Ambulatory Visit (INDEPENDENT_AMBULATORY_CARE_PROVIDER_SITE_OTHER): Payer: Medicare Other | Admitting: Internal Medicine

## 2016-04-02 ENCOUNTER — Encounter (INDEPENDENT_AMBULATORY_CARE_PROVIDER_SITE_OTHER): Payer: Self-pay | Admitting: *Deleted

## 2016-04-02 ENCOUNTER — Other Ambulatory Visit (INDEPENDENT_AMBULATORY_CARE_PROVIDER_SITE_OTHER): Payer: Self-pay | Admitting: *Deleted

## 2016-04-02 ENCOUNTER — Encounter (INDEPENDENT_AMBULATORY_CARE_PROVIDER_SITE_OTHER): Payer: Self-pay | Admitting: Internal Medicine

## 2016-04-02 VITALS — BP 98/50 | HR 68 | Temp 97.0°F | Ht 69.0 in | Wt 189.0 lb

## 2016-04-02 DIAGNOSIS — R195 Other fecal abnormalities: Secondary | ICD-10-CM

## 2016-04-02 NOTE — Progress Notes (Signed)
Subjective:    Patient ID: William Burch, male    DOB: 05-16-1960, 56 y.o.   MRN: [id number]  HPI Here today for f/u. He was seen 02/20/2016 for positive stool card.  Patient had stated that when the stool card was positive, he had been constipated and had to strain to have a BM.  His stool in office was guaiac positive.  3 stool cards sent home with patient were negative. Hx of GERD, IDA and fatty liver. Hx of PE back in 2015  Hx of respiratory failure. Hx of chronic back pain.  Started on Linzess for his constipation. He tells me his constipation is a little better. He is having a BM about every other day.  No melena or BRRB. Appetite is good. No abdominal pain.  12/24/2015 Iron 68, UIBC 22, TIBC 290, %Sat 23, ferritin 185 CBC Latest Ref Rng & Units 03/27/2016 05/18/2014 02/10/2014  WBC 3.8 - 10.8 K/uL 11.6(H) 10.9(H) 16.1(H)  Hemoglobin 13.2 - 17.1 g/dL 82.9 12.5(L) 10.9(L)  Hematocrit 38.5 - 50.0 % 41.6 37.9(L) 32.2(L)  Platelets 140 - 400 K/uL 295 381 264     07/03/2013.  EGD & Colonoscopy   Indications:Patient is 56 year old Caucasian male who was found to have iron deficiency anemia and heme positive stool during recent hospitalization for pneumonia and pulmonary embolism. Hemoglobin is improved by 2 g by po iron. He has chronic GERD and maintain her Nexium which he believes is giving him diarrhea and abdominal pain. He is undergoing diagnostic EGD and colonoscopy  EGD findings; Erosive/ulcerative reflux esophagitis with scarring at distal esophagus. Small sliding hiatal hernia. Mild changes of portal gastropathy.  Colonoscopy findings; Normal colonoscopy.  Comments; Significance of portal gastrapthy unclear but will do ultrasound to rule out chronic liver disease. He possibly lost blood from upper GI tract.  Quit smoking in July of this year.  Was smoking a pack a day.  01/27/2016 H and H 13.0 and 38.5.  Review of Systems     Past Medical History:  Diagnosis Date  . Acid reflux   . Back pain   . Chronic back pain   . Diabetes (HCC)   . Hypertension   . PE (pulmonary embolism)    in Febuary of this year  . 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 BACK SURGERY     l fusion with rods  . BACK SURGERY    . COLONOSCOPY WITH PROPOFOL N/A 07/03/2013   Procedure: COLONOSCOPY WITH PROPOFOL;  Surgeon: Malissa Hippo, MD;  Location: [facility];  Service: Endoscopy;  Laterality: N/A;  in cecum at 0759; withdrawal time 11 minutes  . ESOPHAGOGASTRODUODENOSCOPY (EGD) WITH PROPOFOL N/A 07/03/2013   Procedure: ESOPHAGOGASTRODUODENOSCOPY (EGD) WITH PROPOFOL;  Surgeon: Malissa Hippo, MD;  Location: [facility];  Service: Endoscopy;  Laterality: N/A;    Allergies  Allergen Reactions  . Morphine And Related Anaphylaxis  . Morphine And Related Other (See Comments)    REACTION: patient states that he was administered this medication during surgery. States that he "about died from it." patient is not sure whether the medication caused the reaction or the amount given.  William Burch [Pseudoephedrine Hcl] Rash    Current Outpatient Prescriptions on File Prior to Visit  Medication Sig Dispense Refill  . aspirin 81 MG tablet Take 81 mg by mouth daily.    Marland Kitchen  COMBIVENT RESPIMAT 20-100 MCG/ACT AERS respimat 1 puff every 6 (six) hours as needed.     Marland Kitchen. DEXILANT 60 MG capsule Take 1 Capsule by mouth once daily (Patient taking differently: Take 1 Capsule by mouth once daily as needed for GERD) 30 capsule 5  . ferrous sulfate (FERROUSUL) 325 (65 FE) MG tablet Take 1 tablet (325 mg total) by mouth daily with breakfast.  0  . ibuprofen (ADVIL,MOTRIN) 200 MG tablet Take 400 mg by mouth daily.    Marland Kitchen. linaclotide (LINZESS) 290 MCG CAPS capsule  Take 1 capsule (290 mcg total) by mouth daily before breakfast. 30 capsule 5  . losartan-hydrochlorothiazide (HYZAAR) 100-25 MG per tablet Take 1 tablet by mouth daily.    . Pediatric Multivitamins-Iron (FLINTSTONES PLUS IRON PO) Take 1 tablet by mouth daily.    . pravastatin (PRAVACHOL) 40 MG tablet Take 40 mg by mouth daily.     Marland Kitchen. zolpidem (AMBIEN) 10 MG tablet Take 10 mg by mouth daily as needed for sleep.      No current facility-administered medications on file prior to visit.        Objective:   Physical Exam Blood pressure (!) 98/50, pulse 68, temperature 97 F (36.1 C), height 5\' 9"  (1.753 m), weight 189 lb (85.7 kg). Alert and oriented. Skin warm and dry. Oral mucosa is moist.   . Sclera anicteric, conjunctivae is pink. Thyroid not enlarged. No cervical lymphadenopathy. Lungs clear. Heart regular rate and rhythm.  Abdomen is soft. Bowel sounds are positive. No hepatomegaly. No abdominal masses felt. No tenderness.  No edema to lower extremities. Patient is alert and oriented.       Assessment & Plan:  Guaiac positive stool. CBC normal.  Will discuss with Dr. Karilyn Cotaehman for possible colonoscopy OV in 6 months.

## 2016-04-02 NOTE — Patient Instructions (Signed)
CBC in 4 weeks.  

## 2016-04-09 ENCOUNTER — Telehealth (INDEPENDENT_AMBULATORY_CARE_PROVIDER_SITE_OTHER): Payer: Self-pay | Admitting: Internal Medicine

## 2016-04-09 NOTE — Telephone Encounter (Signed)
Patient called, stated that he just had labs done in January and yesterday he received a letter to have labs again in February.  He wants to know why he needs them again or if this was in error.  [phone]

## 2016-04-09 NOTE — Telephone Encounter (Signed)
Cbc in 4 weeks.  Please send a letter

## 2016-04-14 ENCOUNTER — Other Ambulatory Visit (INDEPENDENT_AMBULATORY_CARE_PROVIDER_SITE_OTHER): Payer: Self-pay | Admitting: *Deleted

## 2016-04-14 DIAGNOSIS — D649 Anemia, unspecified: Secondary | ICD-10-CM

## 2016-04-14 NOTE — Telephone Encounter (Signed)
Patient was called and advised that the lab work will be in March and he will be sent a letter.

## 2016-04-14 NOTE — Telephone Encounter (Signed)
Lab is noted for 4 weeks.  A letter will be sent as a reminder to the patient. 

## 2016-04-24 IMAGING — CT CT ABD-PELV W/ CM
2 of 5 series · 14 of 46 positions shown, 16 images · IV contrast (Omnipaque 300)
Comparison: No priors.

CLINICAL DATA: 53-year-old male with intermittent right upper
quadrant abdominal pain for the past 4-5 days. Constipation with
intermittent nausea and vomiting during the same time period. Pain
increased with activity.

EXAM:
CT ABDOMEN AND PELVIS WITH CONTRAST
TECHNIQUE: Multidetector CT imaging of the abdomen and pelvis was performed
using the standard protocol following bolus administration of
intravenous contrast.
CONTRAST:  100mL OMNIPAQUE IOHEXOL 300 MG/ML SOLN, 50mL OMNIPAQUE
IOHEXOL 300 MG/ML SOLN

[Series 2: abd_pel_with 5.0 b40f · axial · 0.79mm/px · z∈[-548,-43]mm · 11 of 115 slices shown, 13 images]
[im 7/115  soft-tissue]
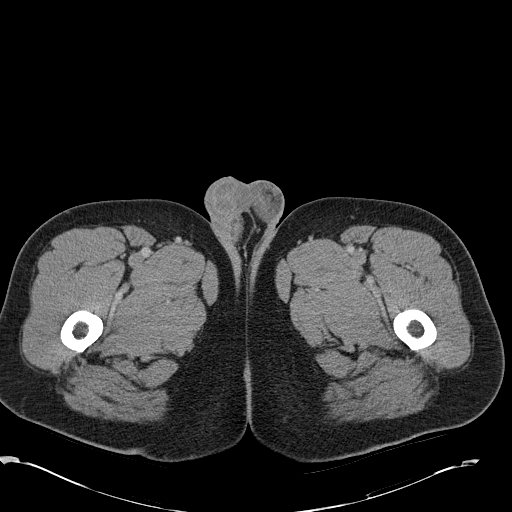
[im 7/115  bone]
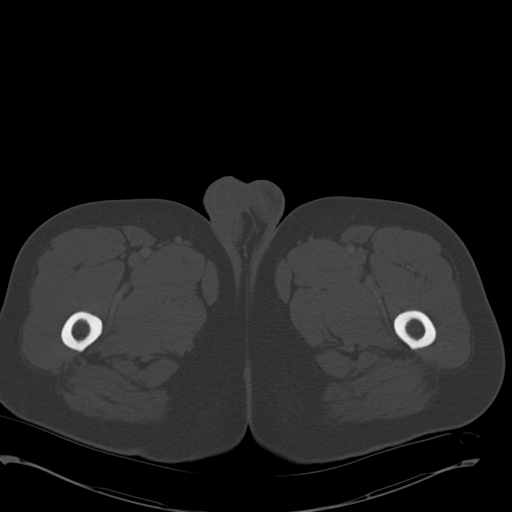
[im 20/115  soft-tissue]
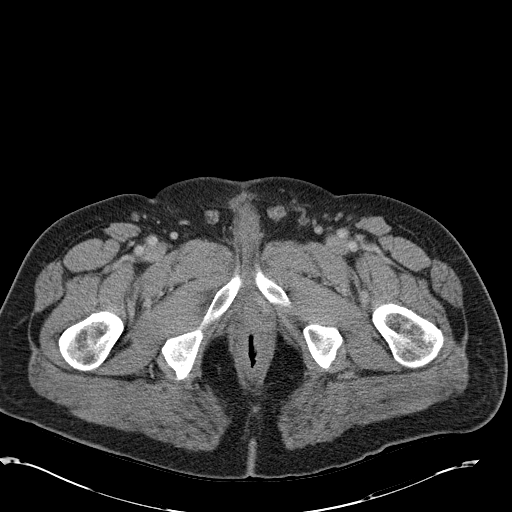
[im 26/115  soft-tissue]
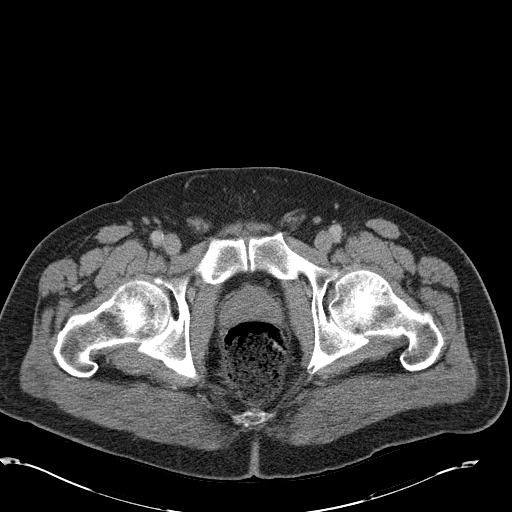
[im 39/115  soft-tissue]
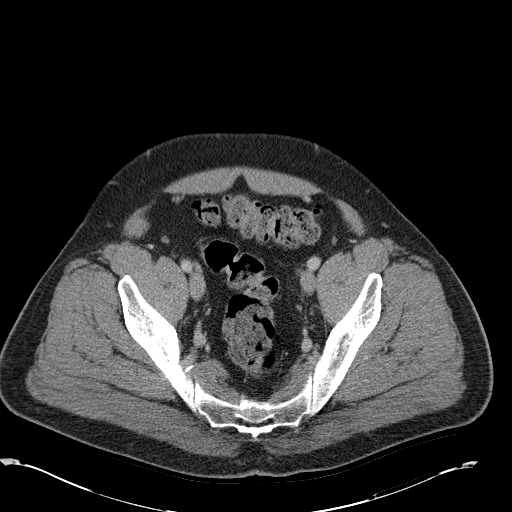
[im 45/115  soft-tissue]
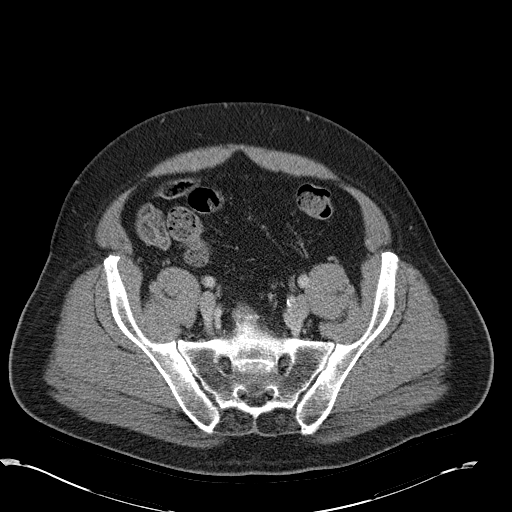
[im 58/115  soft-tissue]
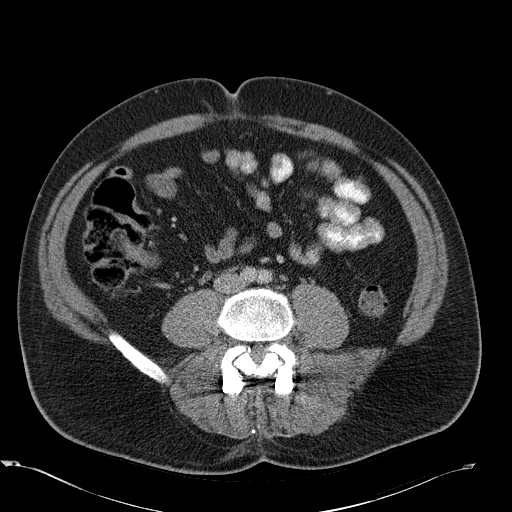
[im 70/115  soft-tissue]
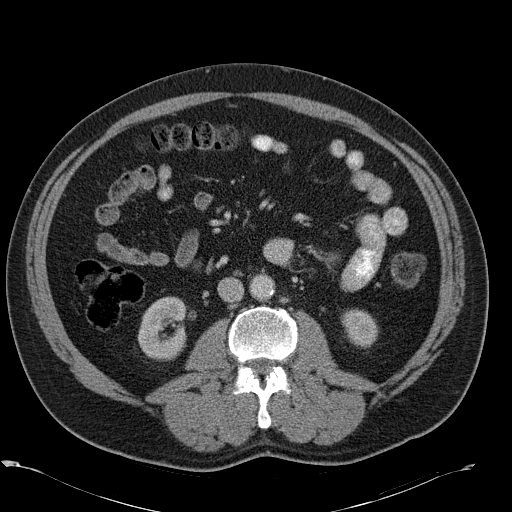
[im 77/115  soft-tissue]
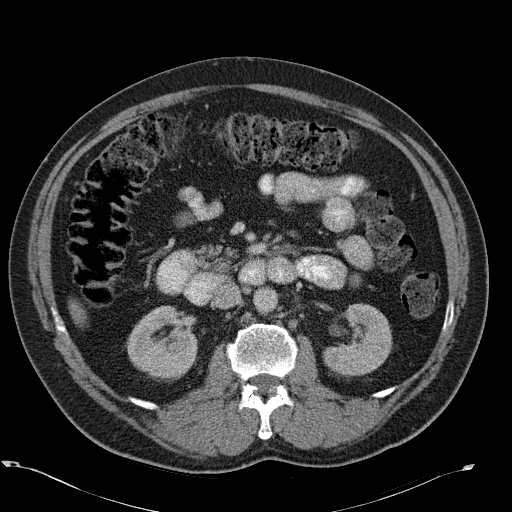
[im 89/115  soft-tissue]
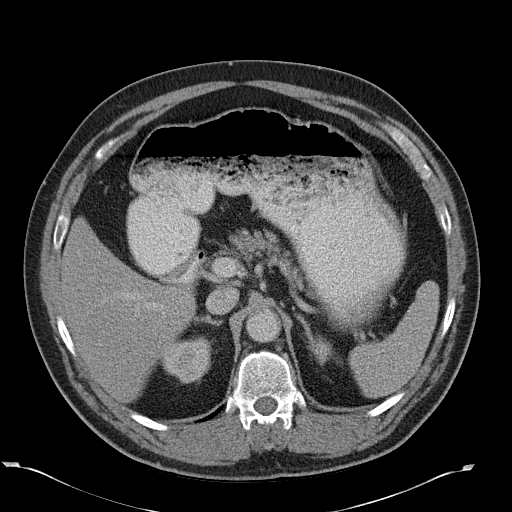
[im 89/115  bone]
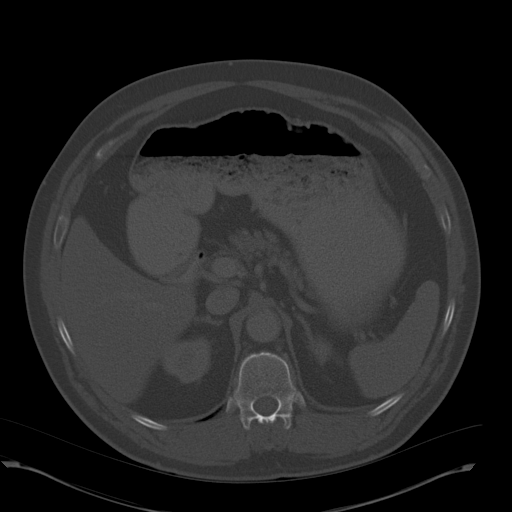
[im 96/115  soft-tissue]
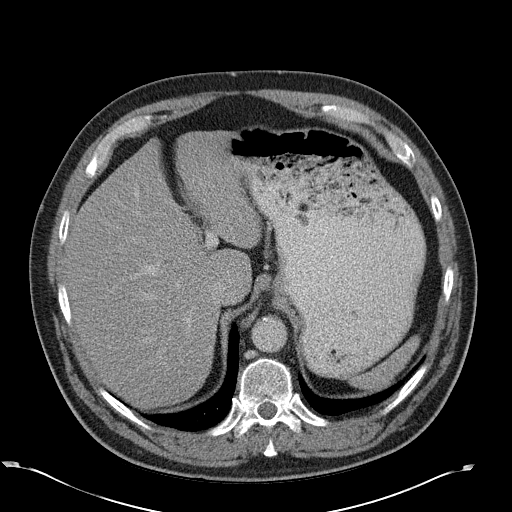
[im 108/115  soft-tissue]
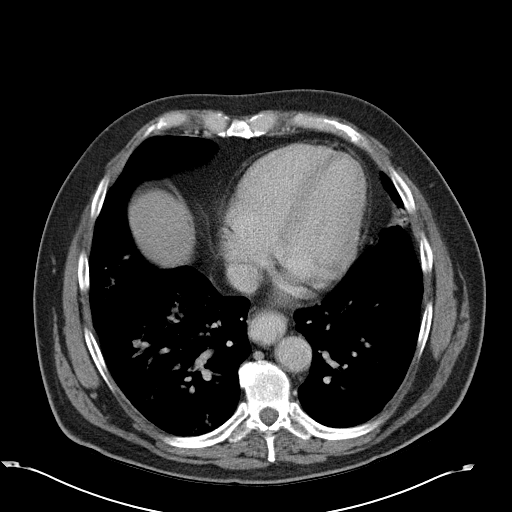

[Series 3: abd_pel_with 3.0 spo cor · coronal · 0.75mm/px · 3 of 108 slices shown]
[im 36/108  soft-tissue]
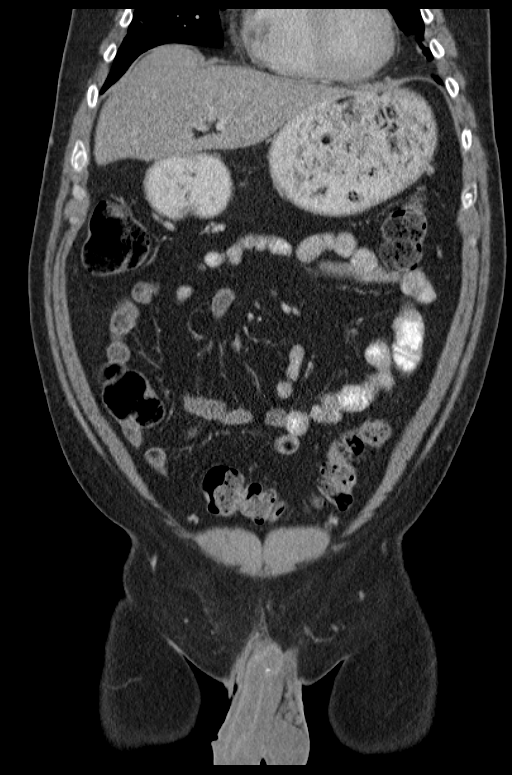
[im 48/108  soft-tissue]
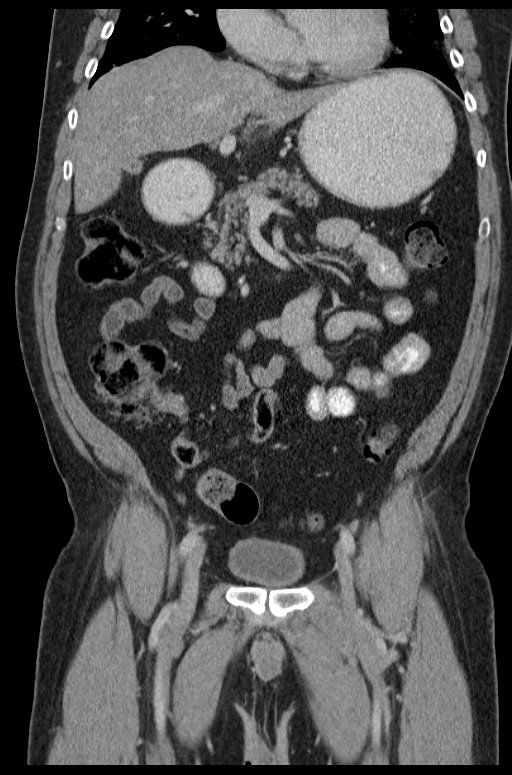
[im 60/108  soft-tissue]
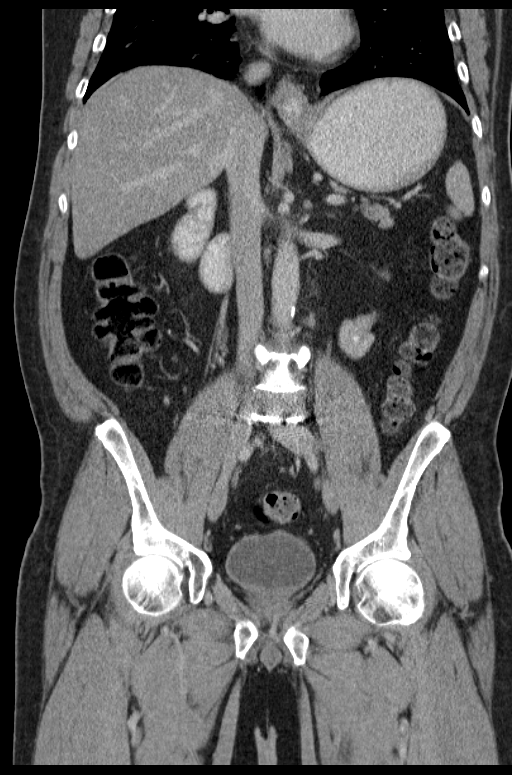

[14 of 46 positions shown; findings below may reference images not displayed]

FINDINGS: Lower chest: Extensive peribronchovascular airspace consolidation
throughout the lung bases bilaterally (right greater than left),
concerning for multilobar bronchopneumonia. Moderate hiatal hernia.
Multiple borderline enlarged and mildly enlarged lower mediastinal
lymph nodes, most evident in the paraesophageal region, measuring up
to 12 mm in short axis. Calcifications of the aortic valve.

Hepatobiliary: No cystic for solid hepatic lesion. No intra or
extrahepatic biliary ductal dilatation. Gallbladder is completely
decompressed, but otherwise unremarkable in appearance.

Pancreas: Unremarkable.

Spleen: Unremarkable.

Adrenals/Urinary Tract: Bilateral adrenal glands and bilateral
kidneys are normal in appearance. No hydroureteronephrosis. Urinary
bladder is normal in appearance.

Stomach/Bowel: The appearance of the stomach is normal. Normal
appendix. No pathologic dilatation of small bowel or colon.

Vascular/Lymphatic: Mild atherosclerosis throughout the abdominal
and pelvic vasculature, without evidence of aneurysm or dissection.
Circumaortic left renal vein (normal anatomical variant)
incidentally noted. Multiple borderline enlarged and minimally
enlarged retroperitoneal lymph nodes, largest of which measures only
1 cm in short axis in the left para-aortic nodal station inferior to
the left renal hilum (image 42 of series 2).

Reproductive: Prostate gland and seminal vesicles are unremarkable
in appearance.

Other: No significant volume of ascites.  No pneumoperitoneum.

Musculoskeletal: Status post PLIF at L5-S1. There are no aggressive
appearing lytic or blastic lesions noted in the visualized portions
of the skeleton.
IMPRESSION: 1. No acute findings in the abdomen or pelvis.
2. However, there is extensive peribronchovascular airspace disease
throughout the visualized lung bases bilaterally, concerning for
severe multilobar bronchopneumonia. Some of this could also be
accounted for by aspiration given the patient's recent history of
vomiting, however, this is not strongly favored.
3. Multiple borderline enlarged and mildly enlarged lower
mediastinal and retroperitoneal lymph nodes. These findings are
nonspecific, and favored to be reactive.
4. Additional incidental findings, as above.

## 2016-05-04 ENCOUNTER — Other Ambulatory Visit (INDEPENDENT_AMBULATORY_CARE_PROVIDER_SITE_OTHER): Payer: Self-pay | Admitting: *Deleted

## 2016-05-04 ENCOUNTER — Encounter (INDEPENDENT_AMBULATORY_CARE_PROVIDER_SITE_OTHER): Payer: Self-pay | Admitting: *Deleted

## 2016-05-04 DIAGNOSIS — D649 Anemia, unspecified: Secondary | ICD-10-CM

## 2016-05-06 ENCOUNTER — Other Ambulatory Visit (INDEPENDENT_AMBULATORY_CARE_PROVIDER_SITE_OTHER): Payer: Self-pay | Admitting: Internal Medicine

## 2016-06-23 ENCOUNTER — Ambulatory Visit (INDEPENDENT_AMBULATORY_CARE_PROVIDER_SITE_OTHER): Payer: Medicare Other | Admitting: Internal Medicine

## 2016-06-23 ENCOUNTER — Encounter (INDEPENDENT_AMBULATORY_CARE_PROVIDER_SITE_OTHER): Payer: Self-pay | Admitting: Internal Medicine

## 2016-06-23 ENCOUNTER — Encounter (INDEPENDENT_AMBULATORY_CARE_PROVIDER_SITE_OTHER): Payer: Self-pay

## 2016-06-23 ENCOUNTER — Other Ambulatory Visit (INDEPENDENT_AMBULATORY_CARE_PROVIDER_SITE_OTHER): Payer: Self-pay | Admitting: *Deleted

## 2016-06-23 VITALS — BP 106/70 | HR 63 | Temp 97.4°F | Resp 18 | Ht 69.0 in | Wt 186.7 lb

## 2016-06-23 DIAGNOSIS — Z862 Personal history of diseases of the blood and blood-forming organs and certain disorders involving the immune mechanism: Secondary | ICD-10-CM | POA: Diagnosis not present

## 2016-06-23 DIAGNOSIS — K5904 Chronic idiopathic constipation: Secondary | ICD-10-CM | POA: Diagnosis not present

## 2016-06-23 DIAGNOSIS — K625 Hemorrhage of anus and rectum: Secondary | ICD-10-CM

## 2016-06-23 DIAGNOSIS — K2100 Gastro-esophageal reflux disease with esophagitis, without bleeding: Secondary | ICD-10-CM

## 2016-06-23 MED ORDER — DEXLANSOPRAZOLE 30 MG PO CPDR: 30.0000 mg | DELAYED_RELEASE_CAPSULE | Freq: Every day | ORAL | 5 refills | Status: DC

## 2016-06-23 MED ORDER — PLECANATIDE 3 MG PO TABS: 3.0000 mg | ORAL_TABLET | Freq: Every day | ORAL | 5 refills | Status: DC

## 2016-06-23 NOTE — Patient Instructions (Addendum)
Physician will call with results of hemoglobin and hematocrit. Notify if Dexilant at reduced dose not effective in controlling heartburn

## 2016-06-23 NOTE — Progress Notes (Signed)
Presenting complaint;  Follow-up for GERD and chronic constipation.  Database and Subjective:  Patient is 56 year old Caucasian male who is here for scheduled visit. He was last seen in December 2017 and January 2018 because of heme positive stool in the setting of constipation. He was begun on Linzess and Hemoccults were repeated and all 3 were negative. Patient's last colonoscopy was 3 years ago and Ms. Setzer, NP felt no further workup was needed. He denies melena or rectal bleeding but he continues to have problems with bowels. He is having 3-4 bowel movements per day. All of his stools are loose and has urgency and on few occasions he had to run into woods in order to prevent mishap. States he eats a lot of fruits and vegetables every day. He walks to 3 miles daily. He states heartburn is well controlled with therapy.   Current Medications: Outpatient Encounter Prescriptions as of 06/23/2016  Medication Sig  . aspirin 81 MG tablet Take 81 mg by mouth daily.  Marland Kitchen BELBUCA 600 MCG FILM 600 mcg every 12 (twelve) hours.   . COMBIVENT RESPIMAT 20-100 MCG/ACT AERS respimat 1 puff every 6 (six) hours as needed.   Marland Kitchen DEXILANT 60 MG capsule Take 1 Capsule by mouth once daily  . ferrous sulfate (FERROUSUL) 325 (65 FE) MG tablet Take 1 tablet (325 mg total) by mouth daily with breakfast.  . linaclotide (LINZESS) 290 MCG CAPS capsule Take 1 capsule (290 mcg total) by mouth daily before breakfast.  . losartan-hydrochlorothiazide (HYZAAR) 100-25 MG per tablet Take 1 tablet by mouth daily.  . metFORMIN (GLUCOPHAGE-XR) 500 MG 24 hr tablet Take 500 mg by mouth daily with breakfast.   . Pediatric Multivitamins-Iron (FLINTSTONES PLUS IRON PO) Take 1 tablet by mouth daily.  . pravastatin (PRAVACHOL) 40 MG tablet Take 40 mg by mouth daily.   . Vitamin D, Ergocalciferol, (DRISDOL) 50000 units CAPS capsule Take 50,000 Units by mouth once a week.  . zolpidem (AMBIEN) 10 MG tablet Take 10 mg by mouth daily as needed  for sleep.   . [DISCONTINUED] ibuprofen (ADVIL,MOTRIN) 200 MG tablet Take 400 mg by mouth daily.   No facility-administered encounter medications on file as of 06/23/2016.      Objective: Blood pressure 106/70, pulse 63, temperature 97.4 F (36.3 C), temperature source Oral, resp. rate 18, height  (1.753 m), weight 186 lb 11.2 oz (84.7 kg). Patient is alert and in no acute distress. Conjunctiva is pink. Sclera is nonicteric Oropharyngeal mucosa is normal. No neck masses or thyromegaly noted. Cardiac exam with regular rhythm normal S1 and S2. No murmur or gallop noted. Auscultation of lungs reveal few scattered rhonchi. Abdomen is symmetrical soft and nontender without organomegaly or masses.  No LE edema or clubbing noted.  Labs/studies Results: Lab data from 03/27/2016  WBC 11.6 and H&H 13.6 and 41.6 with platelet count of 295K.   Assessment:  #1. GERD. He has history of erosive reflux esophagitis. This was diagnosed 3 years ago when he had EGD. He has changed his lifestyle. Symptom control is excellent with current dose of PPI. He may do well with reduced dose. #2. Chronic constipation. Response to Linzess unpredictable. Therefore will change to another medication.  He is on high fiber diet. #3. History of iron deficiency anemia diagnosed 3 years ago when he was found to have erosive reflux esophagitis and colonoscopy was normal. In 3 months ago was normal.  Plan:  Medication list updated.Ferrous sulfate deleted from list  of his medications  as he is not taking this medication. He will continue Flintstones with iron chewable 1 tablet daily. Decrease Dexilant to 30 mg by mouth every morning. Patient will call if heartburn not well-controlled with reduced dose of PPI Discontinue Linzess. Begin Trulance 3 mg by mouth daily. Patient will go the lab for H&H. Office visit in 6 months.

## 2016-06-25 ENCOUNTER — Telehealth (INDEPENDENT_AMBULATORY_CARE_PROVIDER_SITE_OTHER): Payer: Self-pay | Admitting: *Deleted

## 2016-06-25 NOTE — Telephone Encounter (Signed)
We have attempted a PA for the Trulance. Insurance will not approve until patient has tried the Amitiza.  Per Dr.Rehman the patient may try - Amitiza 24 mcg - take 1 by mouth daily #30 with 5 refills. This was called to the [facility]. Patient is aware.

## 2016-09-23 ENCOUNTER — Other Ambulatory Visit (INDEPENDENT_AMBULATORY_CARE_PROVIDER_SITE_OTHER): Payer: Self-pay | Admitting: Internal Medicine

## 2016-09-29 ENCOUNTER — Other Ambulatory Visit (INDEPENDENT_AMBULATORY_CARE_PROVIDER_SITE_OTHER): Payer: Self-pay | Admitting: Internal Medicine

## 2016-11-13 ENCOUNTER — Ambulatory Visit (INDEPENDENT_AMBULATORY_CARE_PROVIDER_SITE_OTHER): Payer: Self-pay | Admitting: Internal Medicine

## 2016-11-30 ENCOUNTER — Other Ambulatory Visit (HOSPITAL_COMMUNITY): Payer: Self-pay | Admitting: Neurology

## 2016-11-30 ENCOUNTER — Ambulatory Visit (INDEPENDENT_AMBULATORY_CARE_PROVIDER_SITE_OTHER): Payer: Medicare Other | Admitting: Internal Medicine

## 2016-11-30 ENCOUNTER — Encounter (INDEPENDENT_AMBULATORY_CARE_PROVIDER_SITE_OTHER): Payer: Self-pay | Admitting: Internal Medicine

## 2016-11-30 ENCOUNTER — Encounter (INDEPENDENT_AMBULATORY_CARE_PROVIDER_SITE_OTHER): Payer: Self-pay

## 2016-11-30 VITALS — BP 120/70 | HR 72 | Temp 98.9°F | Ht 69.0 in | Wt 182.6 lb

## 2016-11-30 DIAGNOSIS — M5416 Radiculopathy, lumbar region: Secondary | ICD-10-CM

## 2016-11-30 DIAGNOSIS — K219 Gastro-esophageal reflux disease without esophagitis: Secondary | ICD-10-CM

## 2016-11-30 DIAGNOSIS — K5900 Constipation, unspecified: Secondary | ICD-10-CM

## 2016-11-30 MED ORDER — LINACLOTIDE 290 MCG PO CAPS: 290.0000 ug | ORAL_CAPSULE | Freq: Every day | ORAL | 5 refills | Status: DC

## 2016-11-30 NOTE — Progress Notes (Signed)
Subjective:    Patient ID: William Burch, male    DOB: 02-28-1961, 56 y.o.   MRN: [id number]  HPI Here today for f/u. Last seen by Dr. Karilyn Burch in April of 2018.  Last seen by me in January because of heme positive stools in the setting of constipation. Three stool cards were negative. Last colonoscopy was 3 yrs ago.  Seen William Seller NP and his stool card was positive 11/06/2016.  He says he had been straining to have the BM.  Stool are normal color. He has not seen any blood.  His appetite is good. No weight loss. He diets and exercises daily. Has a BM about every 5 day.   CBC    Component Value Date/Time   WBC 11.6 (H) 03/27/2016 1114   RBC 4.63 03/27/2016 1114   HGB 13.6 03/27/2016 1114   HCT 41.6 03/27/2016 1114   PLT 295 03/27/2016 1114   MCV 89.8 03/27/2016 1114   MCH 29.4 03/27/2016 1114   MCHC 32.7 03/27/2016 1114   RDW 14.9 03/27/2016 1114   LYMPHSABS 1.0 02/10/2014 1550   MONOABS 0.7 02/10/2014 1550   EOSABS 0.0 02/10/2014 1550   BASOSABS 0.0 02/10/2014 1550      07/03/2013. EGD &Colonoscopy   Indications:Patient is 56 year old Caucasian male who was found to have iron deficiency anemia and heme positive stool during recent hospitalization for pneumonia and pulmonary embolism. Hemoglobin is improved by 2 g by po iron. He has chronic GERD and maintain her Nexium which he believes is giving him diarrhea and abdominal pain. He is undergoing diagnostic EGD and colonoscopy  EGD findings; Erosive/ulcerative reflux esophagitis with scarring at distal esophagus. Small sliding hiatal hernia. Mild changes of portal gastropathy. Colonoscopy was normal.  Review of Systems Past Medical History:  Diagnosis Date  . Acid reflux   . Back pain   . Chronic back pain   . Diabetes (HCC)   . Hypertension   . PE (pulmonary embolism)    in Febuary of this year  . 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 BACK SURGERY     l fusion with rods  . BACK SURGERY    . COLONOSCOPY WITH PROPOFOL N/A 07/03/2013   Procedure: COLONOSCOPY WITH PROPOFOL;  Surgeon: William Hippo, MD;  Location: [facility];  Service: Endoscopy;  Laterality: N/A;  in cecum at 0759; withdrawal time 11 minutes  . ESOPHAGOGASTRODUODENOSCOPY (EGD) WITH PROPOFOL N/A 07/03/2013   Procedure: ESOPHAGOGASTRODUODENOSCOPY (EGD) WITH PROPOFOL;  Surgeon: William Hippo, MD;  Location: [facility];  Service: Endoscopy;  Laterality: N/A;    Allergies  Allergen Reactions  . Morphine And Related Anaphylaxis  . Morphine And Related Other (See Comments)    REACTION: patient states that he was administered this medication during surgery. States that he "about died from it." patient is not sure whether the medication caused the reaction or the amount given.  William Burch [Pseudoephedrine Hcl] Rash    Current Outpatient Prescriptions on File Prior to Visit  Medication Sig Dispense Refill  . AMITIZA 24 MCG capsule Take 1 Capsule by mouth once daily 30 capsule 6  . aspirin 81 MG tablet Take 81 mg by mouth daily.    Marland Kitchen BELBUCA 600 MCG FILM 600 mcg every 12 (twelve) hours.     . COMBIVENT RESPIMAT 20-100 MCG/ACT AERS respimat  1 puff every 6 (six) hours as needed.     Marland Kitchen DEXILANT 60 MG capsule Take 1 Capsule by mouth once daily 30 capsule 5  . losartan-hydrochlorothiazide (HYZAAR) 100-25 MG per tablet Take 1 tablet by mouth daily.    . metFORMIN (GLUCOPHAGE-XR) 500 MG 24 hr tablet Take 500 mg by mouth daily with breakfast.     . Pediatric Multivitamins-Iron (FLINTSTONES PLUS IRON PO) Take 1 tablet by mouth daily.    . pravastatin (PRAVACHOL) 40 MG tablet Take 40 mg by mouth daily.     . Vitamin D, Ergocalciferol, (DRISDOL) 50000 units CAPS capsule Take 50,000 Units by mouth once a week.  11  . zolpidem (AMBIEN) 10 MG tablet Take 10 mg by mouth daily as needed for sleep.      No current  facility-administered medications on file prior to visit.         Objective:   Physical Exam  Vitals:   11/30/16 1513  BP: 120/70  Pulse: 72  Temp: 98.9 F (37.2 C)   Alert and oriented. Skin warm and dry. Oral mucosa is moist.   . Sclera anicteric, conjunctivae is pink. Thyroid not enlarged. No cervical lymphadenopathy. Lungs clear. Heart regular rate and rhythm.  Abdomen is soft. Bowel sounds are positive. No hepatomegaly. No abdominal masses felt. No tenderness.  No edema to lower extremities.           Assessment & Plan:  GERD: Hx of erosive reflux esophagitis. Diagnosed 3 yrs ago.  Continue the Dexilant  Chronic constipation,.  Am going to switch him back to Linzess daily.  Will discuss with Dr. Karilyn Burch possible colonoscopy.

## 2016-11-30 NOTE — Patient Instructions (Signed)
Continue the Dexilant. Stop the Amitiza. Start the [employer]

## 2016-12-01 ENCOUNTER — Telehealth (INDEPENDENT_AMBULATORY_CARE_PROVIDER_SITE_OTHER): Payer: Self-pay | Admitting: Internal Medicine

## 2016-12-01 NOTE — Telephone Encounter (Signed)
Patient has appt on 12/22/2016 with Dr. Karilyn Cota.

## 2016-12-22 ENCOUNTER — Ambulatory Visit (INDEPENDENT_AMBULATORY_CARE_PROVIDER_SITE_OTHER): Payer: Self-pay | Admitting: Internal Medicine

## 2016-12-23 ENCOUNTER — Encounter (INDEPENDENT_AMBULATORY_CARE_PROVIDER_SITE_OTHER): Payer: Self-pay | Admitting: Internal Medicine

## 2017-02-09 ENCOUNTER — Ambulatory Visit (INDEPENDENT_AMBULATORY_CARE_PROVIDER_SITE_OTHER): Payer: Self-pay | Admitting: Internal Medicine

## 2017-02-25 ENCOUNTER — Telehealth (INDEPENDENT_AMBULATORY_CARE_PROVIDER_SITE_OTHER): Payer: Self-pay | Admitting: Internal Medicine

## 2017-02-25 ENCOUNTER — Other Ambulatory Visit (INDEPENDENT_AMBULATORY_CARE_PROVIDER_SITE_OTHER): Payer: Self-pay | Admitting: Internal Medicine

## 2017-02-25 DIAGNOSIS — K5900 Constipation, unspecified: Secondary | ICD-10-CM

## 2017-02-25 MED ORDER — LINACLOTIDE 290 MCG PO CAPS: 290.0000 ug | ORAL_CAPSULE | Freq: Every day | ORAL | 11 refills | Status: DC

## 2017-02-25 NOTE — Telephone Encounter (Signed)
err

## 2017-04-05 ENCOUNTER — Ambulatory Visit (INDEPENDENT_AMBULATORY_CARE_PROVIDER_SITE_OTHER): Payer: Medicare Other | Admitting: Internal Medicine

## 2017-04-05 ENCOUNTER — Encounter (INDEPENDENT_AMBULATORY_CARE_PROVIDER_SITE_OTHER): Payer: Self-pay | Admitting: Internal Medicine

## 2017-04-05 ENCOUNTER — Other Ambulatory Visit (INDEPENDENT_AMBULATORY_CARE_PROVIDER_SITE_OTHER): Payer: Self-pay | Admitting: Internal Medicine

## 2017-04-05 VITALS — BP 118/68 | HR 62 | Temp 97.8°F | Resp 18 | Ht 69.0 in | Wt 200.5 lb

## 2017-04-05 DIAGNOSIS — K2100 Gastro-esophageal reflux disease with esophagitis, without bleeding: Secondary | ICD-10-CM

## 2017-04-05 DIAGNOSIS — K5900 Constipation, unspecified: Secondary | ICD-10-CM

## 2017-04-05 DIAGNOSIS — R531 Weakness: Secondary | ICD-10-CM | POA: Diagnosis not present

## 2017-04-05 DIAGNOSIS — D508 Other iron deficiency anemias: Secondary | ICD-10-CM | POA: Diagnosis not present

## 2017-04-05 LAB — HEMOGLOBIN AND HEMATOCRIT, BLOOD
HEMATOCRIT: 38.3 % — AB (ref 38.5–50.0)
HEMOGLOBIN: 12.6 g/dL — AB (ref 13.2–17.1)

## 2017-04-05 LAB — TSH: TSH: 1.13 mIU/L (ref 0.40–4.50)

## 2017-04-05 MED ORDER — DEXLANSOPRAZOLE 30 MG PO CPDR: 30.0000 mg | DELAYED_RELEASE_CAPSULE | Freq: Every day | ORAL | 5 refills | Status: DC

## 2017-04-05 NOTE — Progress Notes (Signed)
Presenting complaint;  Follow-up for GERD and constipation. History of iron deficiency anemia.  Subjective:  Patient is 57 year old Caucasian male who is here for scheduled visit.  He was last seen 4 months ago for constipation.  Amities I was not working and he was switched to Linzess.  He states he is having good results with Linzess.  He feels Dexilant also helps him and having a bowel movement.  He states GERD symptoms are well controlled.  He may have 1 or 2 episodes of heartburn in a month.  It occurs with certain foods.  He denies nausea vomiting or dysphagia.  He has very good appetite.  He has gained 18 pounds since his last visit.  He states he generally walks 1 mile a day weather permitting. He has a copy of blood work from PCPs office which is reviewed under lab data. Complains of feeling weak and tired.  He is wondering if his hemoglobin has dropped. He has not been screened for thyroid disease recently. He has a copy of Hemoccult and it was negative.   Current Medications: Outpatient Encounter Medications as of 04/05/2017  Medication Sig  . aspirin 81 MG tablet Take 81 mg by mouth daily.  Marland Kitchen BELBUCA 600 MCG FILM 600 mcg every 12 (twelve) hours.   . COMBIVENT RESPIMAT 20-100 MCG/ACT AERS respimat 1 puff every 6 (six) hours as needed.   Marland Kitchen DEXILANT 60 MG capsule Take 1 Capsule by mouth once daily  . losartan-hydrochlorothiazide (HYZAAR) 100-25 MG per tablet Take 1 tablet by mouth daily.  . metFORMIN (GLUCOPHAGE-XR) 500 MG 24 hr tablet Take 500 mg by mouth daily with breakfast.   . Pediatric Multivitamins-Iron (FLINTSTONES PLUS IRON PO) Take 1 tablet by mouth daily.  . pravastatin (PRAVACHOL) 40 MG tablet Take 40 mg by mouth daily.   Valinda Hoar 24 MCG capsule Take 1 Capsule by mouth once daily (Patient not taking: Reported on 04/05/2017)  . linaclotide (LINZESS) 290 MCG CAPS capsule Take 1 capsule (290 mcg total) by mouth daily before breakfast. (Patient not taking: Reported on  04/05/2017)  . [DISCONTINUED] Vitamin D, Ergocalciferol, (DRISDOL) 50000 units CAPS capsule Take 50,000 Units by mouth once a week.  . [DISCONTINUED] zolpidem (AMBIEN) 10 MG tablet Take 10 mg by mouth daily as needed for sleep.    No facility-administered encounter medications on file as of 04/05/2017.      Objective: Blood pressure 118/68, pulse 62, temperature 97.8 F (36.6 C), temperature source Oral, resp. rate 18, height 5\' 9"  (1.753 m), weight 200 lb 8 oz (90.9 kg). Patient is alert and in no acute distress. Conjunctiva is pink. Sclera is nonicteric Oropharyngeal mucosa is normal. No neck masses or thyromegaly noted. Cardiac exam with regular rhythm normal S1 and S2. No murmur or gallop noted. Lungs are clear to auscultation. Abdomenis symmetrical soft and nontender without organomegaly or masses. No LE edema or clubbing noted.  Labs/studies Results:  Lab data from 03/12/2017 WBC 10.1, H&H 12.5 and 37.1, MCV 87 and platelet count 278K. Glucose 103, BUN 25, creatinine 1.14. Serum calcium 9.3.   Bilirubin less than 0.2, AP 76, AST 21, ALT 16, total protein 7.7 and albumin 4.3.   Assessment:  #1.  Chronic constipation secondary to narcotic therapy.  Colonoscopy in April 2015 was normal.  He is responding well to Linzess.   #2.  GERD.  EGD in April 2015 revealed erosive reflux esophagitis and a small sliding hiatal hernia.  Symptom control is satisfactory.  He may do well  with lower PPI dose.  #3.  Weakness.  He has a history of iron deficiency anemia.  Recent hemoglobin was mildly decreased.  He could also have thyroid disease.   Plan:  Decrease Dexilant 30 mg p.o. every morning. Patient will go to the lab for H&H and TSH levels. Office visit in 6 months.

## 2017-04-05 NOTE — Patient Instructions (Addendum)
Physician will call with results of blood test when completed. Notify if lower dose of Dexilant does not control GERD symptoms.

## 2017-10-05 ENCOUNTER — Encounter (INDEPENDENT_AMBULATORY_CARE_PROVIDER_SITE_OTHER): Payer: Self-pay | Admitting: Internal Medicine

## 2017-10-05 ENCOUNTER — Ambulatory Visit (INDEPENDENT_AMBULATORY_CARE_PROVIDER_SITE_OTHER): Payer: Medicare Other | Admitting: Internal Medicine

## 2017-10-05 VITALS — BP 100/68 | HR 62 | Temp 97.8°F | Resp 18 | Ht 69.0 in | Wt 190.6 lb

## 2017-10-05 DIAGNOSIS — K2100 Gastro-esophageal reflux disease with esophagitis, without bleeding: Secondary | ICD-10-CM

## 2017-10-05 DIAGNOSIS — Z862 Personal history of diseases of the blood and blood-forming organs and certain disorders involving the immune mechanism: Secondary | ICD-10-CM

## 2017-10-05 MED ORDER — DEXLANSOPRAZOLE 60 MG PO CPDR: 60.0000 mg | DELAYED_RELEASE_CAPSULE | Freq: Every day | ORAL | 5 refills | Status: DC

## 2017-10-05 NOTE — Progress Notes (Signed)
Presenting complaint;  Follow-up for GERD and history of iron deficiency anemia.  Subjective:  Patient is a 57 year old Caucasian male who has a history of erosive reflux esophagitis and iron deficiency anemia who is here for scheduled visit.  He was last seen on 04/05/2017.  He was doing well with 60 mg of dexlansoprazole daily.  Therefore dose was decreased to 30 mg daily.  He feels his symptoms are not well controlled.  He has intermittent heartburn and frequent regurgitation.  He has not experienced nausea vomiting abdominal pain or melena.  He feels higher PPI does also help regulate his bowel movements.  His bowels move daily and he is not taking Linzess anymore.  He is watching his diet.  He has lost 10 pounds since his last visit.  He states he walks every day.   Current Medications: Outpatient Encounter Medications as of 10/05/2017  Medication Sig  . aspirin 81 MG tablet Take 81 mg by mouth daily.  Marland Kitchen. BELBUCA 600 MCG FILM 600 mcg every 12 (twelve) hours.   . COMBIVENT RESPIMAT 20-100 MCG/ACT AERS respimat 1 puff every 6 (six) hours as needed.   Marland Kitchen. Dexlansoprazole 30 MG capsule Take 1 capsule (30 mg total) by mouth daily before breakfast.  . losartan-hydrochlorothiazide (HYZAAR) 100-25 MG per tablet Take 1 tablet by mouth daily.  . metFORMIN (GLUCOPHAGE-XR) 500 MG 24 hr tablet Take 500 mg by mouth daily with breakfast.   . oxyCODONE-acetaminophen (PERCOCET/ROXICET) 5-325 MG tablet Take 1 tablet by mouth at bedtime.   . Pediatric Multivitamins-Iron (FLINTSTONES PLUS IRON PO) Take 1 tablet by mouth daily.  . pravastatin (PRAVACHOL) 40 MG tablet Take 40 mg by mouth daily.   . [DISCONTINUED] linaclotide (LINZESS) 290 MCG CAPS capsule Take 1 capsule (290 mcg total) by mouth daily before breakfast. (Patient not taking: Reported on 04/05/2017)   No facility-administered encounter medications on file as of 10/05/2017.      Objective: Blood pressure 100/68, pulse 62, temperature 97.8 F (36.6  C), temperature source Oral, resp. rate 18, height 5\' 9"  (1.753 m), weight 190 lb 9.6 oz (86.5 kg). Patient is alert and in no acute distress. Conjunctiva is pink. Sclera is nonicteric Oropharyngeal mucosa is normal. No neck masses or thyromegaly noted. Cardiac exam with regular rhythm normal S1 and S2. No murmur or gallop noted. Lungs are clear to auscultation. Abdomenis symmetrical soft and nontender with organomegaly or masses. No LE edema or clubbing noted.  Labs/studies Results: Lab data from 04/05/2017 H&H 12.6 and 38.3.   Assessment:  #1.  History of erosive reflux esophagitis.  His symptom control is not satisfactory with current dose of dexlansoprazole which is 30 mg daily.  He will go back on 60 mg daily.  Last EGD was in April 2015 when he was found to have erosive/ulcerative reflux esophagitis.  #2.  History of iron deficiency anemia possibly multifactorial.  He could have been losing blood from his esophagus and he may also have impaired iron absorption due to chronic acid suppression.   Plan:  Increase dexlansoprazole to 60 mg p.o. every morning.  New prescription given.  He will continue Flintstone chewable with iron 1 tablet twice daily. Patient will go to the lab for CBC.   Office visit in 1 year.

## 2017-10-05 NOTE — Patient Instructions (Signed)
Physician will call results of blood test when completed. 

## 2018-04-18 ENCOUNTER — Other Ambulatory Visit (INDEPENDENT_AMBULATORY_CARE_PROVIDER_SITE_OTHER): Payer: Self-pay | Admitting: Internal Medicine

## 2018-04-19 ENCOUNTER — Other Ambulatory Visit (INDEPENDENT_AMBULATORY_CARE_PROVIDER_SITE_OTHER): Payer: Self-pay | Admitting: Internal Medicine

## 2018-06-17 ENCOUNTER — Other Ambulatory Visit (INDEPENDENT_AMBULATORY_CARE_PROVIDER_SITE_OTHER): Payer: Self-pay | Admitting: Internal Medicine

## 2018-09-20 DIAGNOSIS — M461 Sacroiliitis, not elsewhere classified: Secondary | ICD-10-CM | POA: Diagnosis not present

## 2018-09-20 DIAGNOSIS — M545 Low back pain: Secondary | ICD-10-CM | POA: Diagnosis not present

## 2018-09-20 DIAGNOSIS — Z79891 Long term (current) use of opiate analgesic: Secondary | ICD-10-CM | POA: Diagnosis not present

## 2018-09-20 DIAGNOSIS — M79605 Pain in left leg: Secondary | ICD-10-CM | POA: Diagnosis not present

## 2018-10-11 ENCOUNTER — Ambulatory Visit (INDEPENDENT_AMBULATORY_CARE_PROVIDER_SITE_OTHER): Payer: Medicare Other | Admitting: Internal Medicine

## 2018-10-11 ENCOUNTER — Encounter (INDEPENDENT_AMBULATORY_CARE_PROVIDER_SITE_OTHER): Payer: Self-pay | Admitting: Internal Medicine

## 2018-10-11 ENCOUNTER — Other Ambulatory Visit: Payer: Self-pay

## 2018-10-11 DIAGNOSIS — Z862 Personal history of diseases of the blood and blood-forming organs and certain disorders involving the immune mechanism: Secondary | ICD-10-CM | POA: Diagnosis not present

## 2018-10-11 DIAGNOSIS — K2100 Gastro-esophageal reflux disease with esophagitis, without bleeding: Secondary | ICD-10-CM

## 2018-10-11 DIAGNOSIS — K219 Gastro-esophageal reflux disease without esophagitis: Secondary | ICD-10-CM | POA: Insufficient documentation

## 2018-10-11 NOTE — Progress Notes (Signed)
Presenting complaint;  Follow-up for GERD and iron deficiency anemia.  Database and subjective:  Patient is 58 year old Caucasian male who is here for scheduled visit.  He was last seen 1 year ago.  He has a history of erosive reflux esophagitis as well as iron deficiency anemia.  Iron deficiency anemia was felt to be due to blood loss from esophagitis and are impaired iron absorption.  On his prior visit dexlansoprazole dose was reduced but he did not do well. He is back on 60 mg daily.  He says he is doing very well.  He rarely has heartburn.  He denies dysphagia nausea vomiting on throat symptoms.  His appetite is good.  He has gained 5 pounds since his last visit.  He says he walks 1 mile every day.  He is also watching his diet.  He states he eats very healthy.  He does not drink colas.  He says he is using pain patch every 14 hours.  He is under care of Dr. Merlene Laughter for chronic low back pain. His bowels move daily.  He denies melena or rectal bleeding.   Current Medications: Outpatient Encounter Medications as of 10/11/2018  Medication Sig  . aspirin 81 MG tablet Take 81 mg by mouth daily.  Marland Kitchen BELBUCA 600 MCG FILM 600 mcg every 12 (twelve) hours.   . DEXILANT 60 MG capsule Take 1 capsule (60 mg total) by mouth daily.  Marland Kitchen losartan-hydrochlorothiazide (HYZAAR) 100-25 MG per tablet Take 1 tablet by mouth daily.  . metFORMIN (GLUCOPHAGE-XR) 500 MG 24 hr tablet Take 500 mg by mouth daily with breakfast.   . oxyCODONE-acetaminophen (PERCOCET/ROXICET) 5-325 MG tablet Take 1 tablet by mouth at bedtime.   . Pediatric Multivitamins-Iron (FLINTSTONES PLUS IRON PO) Take 1 tablet by mouth daily.  . pravastatin (PRAVACHOL) 40 MG tablet Take 40 mg by mouth daily.   . [DISCONTINUED] COMBIVENT RESPIMAT 20-100 MCG/ACT AERS respimat 1 puff every 6 (six) hours as needed.    No facility-administered encounter medications on file as of 10/11/2018.      Objective: Blood pressure 112/73, pulse 70, temperature  97.9 F (36.6 C), temperature source Oral, resp. rate 18, height '5\' 9"'$  (1.753 m), weight 195 lb (88.5 kg). Patient is alert and in no acute distress. Conjunctiva is pink. Sclera is nonicteric Oropharyngeal mucosa is normal. No neck masses or thyromegaly noted. Cardiac exam with regular rhythm normal S1 and S2. No murmur or gallop noted. Lungs are clear to auscultation. Abdomen is symmetrical soft and nontender with organomegaly or masses. No LE edema or clubbing noted.  Labs/studies Results:  CBC Latest Ref Rng & Units 04/05/2017 03/27/2016 05/18/2014  WBC 3.8 - 10.8 K/uL - 11.6(H) 10.9(H)  Hemoglobin 13.2 - 17.1 g/dL 12.6(L) 13.6 12.5(L)  Hematocrit 38.5 - 50.0 % 38.3(L) 41.6 37.9(L)  Platelets 140 - 400 K/uL - 295 381    CMP Latest Ref Rng & Units 03/23/2014 02/10/2014 11/21/2013  Glucose 70 - 99 mg/dL - 134(H) -  BUN 6 - 23 mg/dL - 37(H) -  Creatinine 0.50 - 1.35 mg/dL 1.40(H) 1.47(H) -  Sodium 137 - 147 mEq/L - 136(L) -  Potassium 3.7 - 5.3 mEq/L - 3.3(L) -  Chloride 96 - 112 mEq/L - 96 -  CO2 19 - 32 mEq/L - 25 -  Calcium 8.4 - 10.5 mg/dL - 9.0 -  Total Protein 6.0 - 8.3 g/dL - 8.0 7.8  Total Bilirubin 0.3 - 1.2 mg/dL - 0.5 0.3  Alkaline Phos 39 - 117 U/L -  86 85  AST 0 - 37 U/L - 35 18  ALT 0 - 53 U/L - 28 18    Hepatic Function Latest Ref Rng & Units 02/10/2014 11/21/2013 04/18/2013  Total Protein 6.0 - 8.3 g/dL 8.0 7.8 8.1  Albumin 3.5 - 5.2 g/dL 3.4(L) 4.1 3.3(L)  AST 0 - 37 U/L 35 18 36  ALT 0 - 53 U/L '28 18 19  '$ Alk Phosphatase 39 - 117 U/L 86 85 69  Total Bilirubin 0.3 - 1.2 mg/dL 0.5 0.3 0.3  Bilirubin, Direct 0.0 - 0.3 mg/dL - 0.1 -    No recent blood work on [facility].  Assessment:  #1.  History of erosive reflux esophagitis.  He is doing well with full dose dexlansoprazole and dietary measures.  Previously he did not do well with dose reduction.  Will try him on low-dose next year.  #2.  History of iron deficiency anemia with work-up 5 years ago.  He remains on  low-dose iron.  He possibly has impaired iron absorption in the setting of chronic acid suppression.   Plan:  Patient will go to the lab for CBC. Continue antireflux measures and dexlansoprazole at current dose of 60 mg daily 30 minutes before breakfast. Next refill due in December 2020. Patient will return for office visit in 1 year.

## 2018-10-11 NOTE — Patient Instructions (Signed)
Physician will call with results of blood test when completed. Please call office if dexlansoprazole stops working.

## 2018-11-30 DIAGNOSIS — M79672 Pain in left foot: Secondary | ICD-10-CM | POA: Diagnosis not present

## 2018-11-30 DIAGNOSIS — E1169 Type 2 diabetes mellitus with other specified complication: Secondary | ICD-10-CM | POA: Diagnosis not present

## 2018-11-30 DIAGNOSIS — R7309 Other abnormal glucose: Secondary | ICD-10-CM | POA: Diagnosis not present

## 2018-11-30 DIAGNOSIS — Z0000 Encounter for general adult medical examination without abnormal findings: Secondary | ICD-10-CM | POA: Diagnosis not present

## 2018-11-30 DIAGNOSIS — E785 Hyperlipidemia, unspecified: Secondary | ICD-10-CM | POA: Diagnosis not present

## 2018-11-30 DIAGNOSIS — I10 Essential (primary) hypertension: Secondary | ICD-10-CM | POA: Diagnosis not present

## 2018-11-30 DIAGNOSIS — J449 Chronic obstructive pulmonary disease, unspecified: Secondary | ICD-10-CM | POA: Diagnosis not present

## 2018-12-13 DIAGNOSIS — Z79891 Long term (current) use of opiate analgesic: Secondary | ICD-10-CM | POA: Diagnosis not present

## 2018-12-13 DIAGNOSIS — M461 Sacroiliitis, not elsewhere classified: Secondary | ICD-10-CM | POA: Diagnosis not present

## 2018-12-13 DIAGNOSIS — M545 Low back pain: Secondary | ICD-10-CM | POA: Diagnosis not present

## 2018-12-13 DIAGNOSIS — M79605 Pain in left leg: Secondary | ICD-10-CM | POA: Diagnosis not present

## 2018-12-15 DIAGNOSIS — M79672 Pain in left foot: Secondary | ICD-10-CM | POA: Diagnosis not present

## 2018-12-15 DIAGNOSIS — M722 Plantar fascial fibromatosis: Secondary | ICD-10-CM | POA: Diagnosis not present

## 2019-02-07 ENCOUNTER — Other Ambulatory Visit (INDEPENDENT_AMBULATORY_CARE_PROVIDER_SITE_OTHER): Payer: Self-pay | Admitting: Internal Medicine

## 2019-05-11 ENCOUNTER — Ambulatory Visit: Payer: Medicare Other | Attending: Internal Medicine

## 2019-05-11 DIAGNOSIS — Z23 Encounter for immunization: Secondary | ICD-10-CM | POA: Insufficient documentation

## 2019-05-11 NOTE — Progress Notes (Signed)
   Covid-19 Vaccination Clinic  Name:  William Burch    MRN: [id number] DOB: February 02, 1961  05/11/2019  William Burch was observed post Covid-19 immunization for 15 minutes without incident. He was provided with Vaccine Information Sheet and instruction to access the V-Safe system.   William Burch was instructed to call 911 with any severe reactions post vaccine: Marland Kitchen Difficulty breathing  . Swelling of face and throat  . A fast heartbeat  . A bad rash all over body  . Dizziness and weakness   Immunizations Administered    Name Date Dose VIS Date Route   Moderna COVID-19 Vaccine 05/11/2019  8:42 AM 0.5 mL 02/07/2019 Intramuscular   Manufacturer: Moderna   Lot: [id number]   NDC: 38250-539-76

## 2019-06-09 ENCOUNTER — Other Ambulatory Visit (INDEPENDENT_AMBULATORY_CARE_PROVIDER_SITE_OTHER): Payer: Self-pay | Admitting: Internal Medicine

## 2019-06-13 ENCOUNTER — Ambulatory Visit: Payer: Medicare Other | Attending: Internal Medicine

## 2019-06-13 DIAGNOSIS — Z23 Encounter for immunization: Secondary | ICD-10-CM

## 2019-06-13 NOTE — Progress Notes (Signed)
   Covid-19 Vaccination Clinic  Name:  William Burch    MRN: [id number] DOB: 08-11-1960  06/13/2019  [name] was observed post Covid-19 immunization for 15 minutes without incident. He was provided with Vaccine Information Sheet and instruction to access the V-Safe system.   [name] was instructed to call 911 with any severe reactions post vaccine: Marland Kitchen Difficulty breathing  . Swelling of face and throat  . A fast heartbeat  . A bad rash all over body  . Dizziness and weakness   Immunizations Administered    Name Date Dose VIS Date Route   Moderna COVID-19 Vaccine 06/13/2019  8:16 AM 0.5 mL 02/07/2019 Intramuscular   Manufacturer: Moderna   Lot: [id number]   NDC: 09106-816-61

## 2019-10-04 ENCOUNTER — Other Ambulatory Visit (INDEPENDENT_AMBULATORY_CARE_PROVIDER_SITE_OTHER): Payer: Self-pay | Admitting: Internal Medicine

## 2019-10-17 ENCOUNTER — Ambulatory Visit (INDEPENDENT_AMBULATORY_CARE_PROVIDER_SITE_OTHER): Payer: Medicare Other | Admitting: Internal Medicine

## 2019-11-03 ENCOUNTER — Other Ambulatory Visit (INDEPENDENT_AMBULATORY_CARE_PROVIDER_SITE_OTHER): Payer: Self-pay | Admitting: Gastroenterology

## 2019-11-29 ENCOUNTER — Other Ambulatory Visit (INDEPENDENT_AMBULATORY_CARE_PROVIDER_SITE_OTHER): Payer: Self-pay | Admitting: Gastroenterology

## 2019-12-05 ENCOUNTER — Ambulatory Visit (INDEPENDENT_AMBULATORY_CARE_PROVIDER_SITE_OTHER): Payer: Medicare Other | Admitting: Internal Medicine

## 2019-12-29 ENCOUNTER — Other Ambulatory Visit (INDEPENDENT_AMBULATORY_CARE_PROVIDER_SITE_OTHER): Payer: Self-pay | Admitting: Gastroenterology

## 2020-01-30 ENCOUNTER — Other Ambulatory Visit (INDEPENDENT_AMBULATORY_CARE_PROVIDER_SITE_OTHER): Payer: Self-pay | Admitting: Gastroenterology

## 2020-01-30 NOTE — Telephone Encounter (Signed)
Last seen Dr. Karilyn Cota on 10/11/2018 for Genella Rife.

## 2020-02-29 ENCOUNTER — Other Ambulatory Visit (INDEPENDENT_AMBULATORY_CARE_PROVIDER_SITE_OTHER): Payer: Self-pay

## 2020-02-29 MED ORDER — DEXILANT 60 MG PO CPDR: 1.0000 | DELAYED_RELEASE_CAPSULE | Freq: Every day | ORAL | 0 refills | Status: DC

## 2020-06-04 ENCOUNTER — Other Ambulatory Visit (INDEPENDENT_AMBULATORY_CARE_PROVIDER_SITE_OTHER): Payer: Self-pay | Admitting: Internal Medicine

## 2021-06-12 ENCOUNTER — Other Ambulatory Visit (HOSPITAL_COMMUNITY): Payer: Self-pay | Admitting: Neurology

## 2021-06-12 DIAGNOSIS — M5416 Radiculopathy, lumbar region: Secondary | ICD-10-CM

## 2021-07-22 ENCOUNTER — Ambulatory Visit (HOSPITAL_COMMUNITY)
Admission: RE | Admit: 2021-07-22 | Discharge: 2021-07-22 | Disposition: A | Discharge status: Home / Self Care | Payer: Medicare Other | Source: Ambulatory Visit | Attending: Neurology | Admitting: Neurology

## 2021-07-22 DIAGNOSIS — M5416 Radiculopathy, lumbar region: Secondary | ICD-10-CM | POA: Diagnosis present

## 2021-11-03 NOTE — Progress Notes (Signed)
 Urgent Care Department Provider Note    HPI  SUBJECTIVE:  William Burch is a 61 y.o. male who presents with  History   Past Medical History:  Diagnosis Date   Anxiety    COPD (chronic obstructive pulmonary disease) (CMS-HCC)    Depression    Diabetes mellitus (CMS-HCC)    Hyperlipidemia    Hypertension    Thoracic aortic aneurysm (CMS-HCC)     No past surgical history on file.  No family history on file.  Social History   Tobacco Use   Smoking status: Some Days    Types: Cigarettes    Passive exposure: Current   Smokeless tobacco: Never  Vaping Use   Vaping Use: Never used  Substance Use Topics   Alcohol use: Never     Current Outpatient Medications:    aspirin/calcium carb/magnesium (BUFFERIN ORAL), Take 81 mg by mouth daily., Disp: , Rfl:    buprenorphine HCL (BELBUCA) 750 mcg Film, PLACE ONE FILM TO INSIDE OF CHEEK TWICE DAILY, Disp: , Rfl:    dexlansoprazole  60 mg capsule, Take 1 capsule (60 mg total) by mouth daily., Disp: , Rfl:    doxepin (SINEQUAN) 10 MG capsule, SMARTSIG:1-2 Capsule(s) By Mouth Every Night PRN, Disp: , Rfl:    gabapentin (NEURONTIN) 100 MG capsule, Take by mouth., Disp: , Rfl:    hydrOXYzine (ATARAX) 25 MG tablet, Take 1 tablet (25 mg total) by mouth every four (4) hours as needed for itching., Disp: 30 tablet, Rfl: 0   imipramine (TOFRANIL) 25 MG tablet, Take 1 tablet (25 mg total) by mouth nightly., Disp: , Rfl:    losartan (COZAAR) 100 MG tablet, TAKE ONE HALF TABLET (50 MG DOSE) BY MOUTH DAILY. HOLD IF SYSTOLIC BLOOD PRESSURE LESS THAN 115., Disp: , Rfl:    losartan (COZAAR) 50 MG tablet, , Disp: , Rfl:    metFORMIN (GLUCOPHAGE-XR) 500 MG 24 hr tablet, TAKE (1) TABLET DAILY WITH BREAKFAST., Disp: , Rfl:    metoprolol  succinate (TOPROL -XL) 25 MG 24 hr tablet, TAKE 1/2 TABLET ONCE DAILY- HOLD IF HEART RATE LESS THAN 65 OR SYSTOLIC BLOOD PRESSURE LESS THAN 110, Disp: , Rfl:    oxyCODONE -acetaminophen  (PERCOCET)  5-325 mg per tablet, Take 1 tablet by mouth every six (6) hours as needed., Disp: , Rfl:    pediatric multivitamin-iron Chew, Chew 1 tablet daily., Disp: , Rfl:    pravastatin (PRAVACHOL) 40 MG tablet, Take one tablet by mouth daily at bedtime., Disp: , Rfl:    sulfamethoxazole-trimethoprim (BACTRIM DS) 800-160 mg per tablet, Take 1 tablet (160 mg of trimethoprim total) by mouth Two (2) times a day., Disp: 20 tablet, Rfl: 0   sulfamethoxazole-trimethoprim (BACTRIM DS) 800-160 mg per tablet, Take 1 tablet (160 mg of trimethoprim total) by mouth every twelve (12) hours for 5 days., Disp: 10 tablet, Rfl: 0  ROS   As noted in HPI. All other ROS negative.    Physical Exam   BP 110/70   Pulse 72   Temp 36.8 C (98.3 F) (Oral)   Resp 20   Ht 175.3 cm (5' 9)   Wt 93 kg (205 lb)   SpO2 99%   BMI 30.27 kg/m   Constitutional:  Well developed, well nourished, no acute distress Eyes:   EOMI, conjunctiva normal bilaterally HENT:  Normocephalic, atraumatic,  Respiratory:  Normal inspiratory effort lungs CTA Cardiovascular:  Normal rate s1 s2 w/o murmur GI:  nondistended Integument: Right arm / wrist laceration with sutures approximating. Musculoskeletal:  No edema,  no deformities Neurologic:  Alert & oriented x 3, no focal neuro deficits Psychiatric:  Speech and behavior appropriate   Procedure Right wrist laceration sutures removed and laceration remains approximated and well healed. Urgent Care Course   No orders of the defined types were placed in this encounter.     Patient was never admitted. Urgent Care Clinical Impression   Final diagnoses:  Visit for suture removal (Primary)    Urgent Care Assessment/Plan  Push po fluids, rest, tylenol  and motrin over the counter as directed, return to clinic or follow up if symptoms persist or worsen.  Discussed medical decision-making, plan for follow-up with patient. Discussed signs and symptoms that should prompt return to the  emergency department. Patient agrees with plan.  New Prescriptions   No medications on file

## 2024-02-22 ENCOUNTER — Encounter (INDEPENDENT_AMBULATORY_CARE_PROVIDER_SITE_OTHER): Payer: Self-pay | Admitting: *Deleted

## 2024-03-27 ENCOUNTER — Telehealth (INDEPENDENT_AMBULATORY_CARE_PROVIDER_SITE_OTHER): Payer: Self-pay | Admitting: Gastroenterology

## 2024-03-27 ENCOUNTER — Encounter (INDEPENDENT_AMBULATORY_CARE_PROVIDER_SITE_OTHER): Payer: Self-pay | Admitting: Gastroenterology

## 2024-03-27 ENCOUNTER — Ambulatory Visit (INDEPENDENT_AMBULATORY_CARE_PROVIDER_SITE_OTHER): Admitting: Gastroenterology

## 2024-03-27 VITALS — BP 95/56 | HR 61 | Temp 97.4°F | Ht 71.0 in | Wt 203.0 lb

## 2024-03-27 DIAGNOSIS — K5904 Chronic idiopathic constipation: Secondary | ICD-10-CM | POA: Insufficient documentation

## 2024-03-27 DIAGNOSIS — Z1211 Encounter for screening for malignant neoplasm of colon: Secondary | ICD-10-CM | POA: Insufficient documentation

## 2024-03-27 DIAGNOSIS — K2100 Gastro-esophageal reflux disease with esophagitis, without bleeding: Secondary | ICD-10-CM | POA: Diagnosis not present

## 2024-03-27 DIAGNOSIS — K5900 Constipation, unspecified: Secondary | ICD-10-CM | POA: Diagnosis not present

## 2024-03-27 MED ORDER — RABEPRAZOLE SODIUM 20 MG PO TBEC: 20.0000 mg | DELAYED_RELEASE_TABLET | Freq: Every day | ORAL | 5 refills | Status: AC

## 2024-03-27 NOTE — Patient Instructions (Signed)
 It was very nice to meet you today, as dicussed with will plan for the following :  1) upper endoscopy and colonoscopy

## 2024-03-27 NOTE — Progress Notes (Signed)
 William Burch , M.D. Gastroenterology & Hepatology [facility] [address] [place], [place] [address] Primary Care Physician: William Comer GAILS, NP [address] [place] [address]   History of Present Illness:  William Burch is a 64 y.o. male with history of erosive esophagitis, chronic iron deficiency anemia who presents for evaluation of GERD complicated with erosive esophagitis, constipation and screening colonoscopy  Patient was last seen by Dr. Bill in 2020 where he was maintained on dexlansoprazole   60 mg .Currently patient reports he gets constipated despite taking Linzess .  Has been taking dexlansoprazole  with good response .SABRA  Previously on iron supplementation, no recent CBC iron profile in system The patient denies having any nausea, vomiting, fever, chills, hematochezia, melena, hematemesis, abdominal distention, abdominal pain, diarrhea, jaundice, pruritus or weight loss.  2015  EGD findings; Erosive/ulcerative reflux esophagitis with scarring at distal esophagus. Small sliding hiatal hernia. Mild changes of portal gastropathy.   Colonoscopy findings; Normal colonoscopy.    Labs 02/2024 normal liver enzymes  H. 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/    Past Surgical History: Past Surgical History:  Procedure Laterality Date   BACK SURGERY     l fusion with rods   BACK SURGERY     COLONOSCOPY     COLONOSCOPY WITH PROPOFOL  N/A 07/03/2013   Procedure: COLONOSCOPY WITH PROPOFOL ;  Surgeon: William RAYMOND Rivet, MD;  Location: [facility];  Service: Endoscopy;  Laterality: N/A;  in cecum at 0759; withdrawal  time 11 minutes   ESOPHAGOGASTRODUODENOSCOPY (EGD) WITH PROPOFOL  N/A 07/03/2013   Procedure: ESOPHAGOGASTRODUODENOSCOPY (EGD) WITH PROPOFOL ;  Surgeon: William RAYMOND Rivet, MD;  Location: [facility];  Service: Endoscopy;  Laterality: N/A;   UPPER GASTROINTESTINAL ENDOSCOPY      Family History: Family History  Problem Relation Age of Onset   Pancreatic cancer Mother    Pancreatic cancer Father     Social History:Tobacco Use History[1] Social History   Substance and Sexual Activity  Alcohol Use No   Social History   Substance and Sexual Activity  Drug Use No    Allergies: Allergies[2]  Medications: Current Outpatient Medications  Medication Sig Dispense Refill   amLODipine (NORVASC) 5 MG tablet Take by mouth.     aspirin 81 MG tablet Take 81 mg by mouth daily.     BELBUCA 600 MCG FILM 600 mcg every 12 (twelve) hours.      dexlansoprazole  (DEXILANT ) 60 MG capsule Take 1 capsule (60 mg total) by mouth daily. Take 30 min before breakfast. Due for yearly office visit for refills. 90 capsule 0   FARXIGA 5 MG TABS tablet Take 5 mg by mouth daily.     losartan-hydrochlorothiazide (HYZAAR) 100-25 MG per tablet Take 1 tablet by mouth daily.     metFORMIN (GLUCOPHAGE-XR) 500 MG 24 hr tablet Take 500 mg by mouth daily with breakfast.      metoprolol  succinate (TOPROL -XL) 25 MG 24 hr tablet Take 25 mg by mouth daily.     oxyCODONE -acetaminophen  (PERCOCET/ROXICET) 5-325 MG tablet Take 1 tablet by mouth at bedtime.  Pediatric Multivitamins-Iron (FLINTSTONES PLUS IRON PO) Take 1 tablet by mouth daily.     pravastatin (PRAVACHOL) 40 MG tablet Take 40 mg by mouth daily.      No current facility-administered medications for this visit.    Review of Systems: GENERAL: negative for malaise, night sweats HEENT: No changes in hearing or vision, no nose bleeds or other nasal problems. NECK: Negative for lumps, goiter, pain and significant neck swelling RESPIRATORY: Negative for cough,  wheezing CARDIOVASCULAR: Negative for chest pain, leg swelling, palpitations, orthopnea GI: SEE HPI MUSCULOSKELETAL: Negative for joint pain or swelling, back pain, and muscle pain. SKIN: Negative for lesions, rash HEMATOLOGY Negative for prolonged bleeding, bruising easily, and swollen nodes. ENDOCRINE: Negative for cold or heat intolerance, polyuria, polydipsia and goiter. NEURO: negative for tremor, gait imbalance, syncope and seizures. The remainder of the review of systems is noncontributory.   Physical Exam: BP (!) 95/56   Pulse 61   Temp (!) 97.4 F (36.3 C)   Ht 5' 11 (1.803 m)   Wt 203 lb (92.1 kg)   BMI 28.31 kg/m  GENERAL: The patient is AO x3, in no acute distress. HEENT: Head is normocephalic and atraumatic. EOMI are intact. Mouth is well hydrated and without lesions. NECK: Supple. No masses LUNGS: Clear to auscultation. No presence of rhonchi/wheezing/rales. Adequate chest expansion HEART: RRR, normal s1 and s2. ABDOMEN: Soft, nontender, no guarding, no peritoneal signs, and nondistended. BS +. No masses.   Imaging/Labs: as above     Latest Ref Rng & Units 04/05/2017   12:48 PM 03/27/2016   11:14 AM 05/18/2014    9:10 AM  CBC  WBC 3.8 - 10.8 K/uL  11.6  10.9   Hemoglobin 13.2 - 17.1 g/dL 87.3  86.3  87.4   Hematocrit 38.5 - 50.0 % 38.3  41.6  37.9   Platelets 140 - 400 K/uL  295  381    Lab Results  Component Value Date   IRON 68 12/24/2015   TIBC 290 12/24/2015   FERRITIN 185 12/24/2015    I personally reviewed and interpreted the available labs, imaging and endoscopic files.  Impression and Plan:  William Burch is a 64 y.o. male with history of erosive esophagitis, chronic iron deficiency anemia who presents for evaluation of GERD complicated with erosive esophagitis, constipation and screening colonoscopy  #GERD #Constipation  #Erosive esophagitis   2015 patient had upper endoscopy reported to to have erosive esophagitis.  Given longstanding  history of esophagitis/GERD with last EGD demonstrating esophagitis would recommend repeat endoscopy to assess for any chronic sequela such as Barrett's, peptic stricture  Patient currently on Linzess  145 mcg, advised to increase it to 290 mcg as he continues to be constipated  Continue dexlansoprazole  60 mg  #Colon cancer screening   The patient was counseled regarding the importance of colorectal cancer screening. The benefits of screening include early detection of colorectal cancer and precancerous polyps, which can improve treatment outcomes and reduce mortality. Risks associated with screening, particularly colonoscopy, include potential complications such as bleeding and perforation. After deciding different modalities for screening for colon cancer , patient has opted to pursue Colonoscopy   All questions were answered.      Shadana Pry Faizan Maleny Candy, MD Gastroenterology and Hepatology [facility]   This chart has been completed using [facility] Dictation software, and while attempts have been made to ensure accuracy , certain words and phrases may not be transcribed as intended      [1]  Social History Tobacco Use  Smoking Status Every Day  Smokeless Tobacco Never  [2]  Allergies Allergen Reactions   Morphine And Codeine Anaphylaxis   Morphine And Codeine Other (See Comments)    REACTION: patient states that he was administered this medication during surgery. States that he about died from it. patient is not sure whether the medication caused the reaction or the amount given.   Sudafed [Pseudoephedrine Hcl] Rash

## 2024-03-27 NOTE — Addendum Note (Signed)
 Addended by: CINDERELLA DEATRICE SMILES on: 03/27/2024 12:28 PM   Modules accepted: Orders

## 2024-03-27 NOTE — Telephone Encounter (Signed)
 I have it to rabeprazole 

## 2024-03-27 NOTE — Telephone Encounter (Signed)
 Noted.

## 2024-03-27 NOTE — Telephone Encounter (Signed)
 Pt would like to know if he can be changed to something other than Dexilant . Pt states the Dexilant  is not agreeing with stomach. Please advise. Thank you!  [facility]

## 2024-03-28 ENCOUNTER — Telehealth (INDEPENDENT_AMBULATORY_CARE_PROVIDER_SITE_OTHER): Payer: Self-pay

## 2024-03-28 MED ORDER — PEG 3350-KCL-NA BICARB-NACL 420 G PO SOLR: 4000.0000 mL | Freq: Once | ORAL | 0 refills | Status: AC

## 2024-03-28 NOTE — Telephone Encounter (Signed)
 PA on [facility] for TCS/EGD: Notification or Prior Authorization is not required for the requested services You are not required to submit a notification/prior authorization based on the information provided. Decision ID #: [id number]

## 2024-03-28 NOTE — Telephone Encounter (Signed)
 Spoke with patient, scheduled TCS/EGD for 04/28/2024. Rx sent to pharmacy. Instructions mailed.

## 2024-04-28 ENCOUNTER — Encounter (HOSPITAL_COMMUNITY): Payer: Self-pay

## 2024-04-28 ENCOUNTER — Ambulatory Visit (HOSPITAL_COMMUNITY): Admit: 2024-04-28 | Admitting: Gastroenterology

## 2024-04-28 SURGERY — COLONOSCOPY
Anesthesia: Choice

## 2024-07-10 ENCOUNTER — Ambulatory Visit (INDEPENDENT_AMBULATORY_CARE_PROVIDER_SITE_OTHER): Admitting: Gastroenterology
